# Patient Record
Sex: Female | Born: 2000 | Hispanic: Yes | Marital: Single | Smoking: Never smoker
Health system: Southern US, Community
[De-identification: ages and names within clinical notes are randomized; demographics above are authoritative.]

## PROBLEM LIST (undated history)

## (undated) DIAGNOSIS — R569 Unspecified convulsions: Secondary | ICD-10-CM

## (undated) DIAGNOSIS — G40909 Epilepsy, unspecified, not intractable, without status epilepticus: Secondary | ICD-10-CM

## (undated) HISTORY — PX: NO PAST SURGERIES: SHX2092

## (undated) HISTORY — DX: Epilepsy, unspecified, not intractable, without status epilepticus: G40.909

---
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
## 2022-02-12 NOTE — Progress Notes (Incomplete)
? ?  Patient: Mary Giles MRN: [id number] ?Sex: female DOB: 2001-04-14 ? ?Provider: Lorenz Coaster, MD ?Location of Care: [facility] ? ?Note type: Routine follow-up ? ?History of Present Illness: ? ?Mary Giles is a 21 y.o. female with history of generalized epilepsy who I am seeing for routine follow-up. Patient was last seen on 08/13/21 where I continued Briviact and refilled Valtoco.  Since the last appointment, there are no relevant visits noted in the patients chart.  ? ?Patient presents today with ***.    ? ? ?Screenings: ?PHQSADS ? ?Patient History:  ? ?Diagnostics:  ? ? ?Past Medical History ?Past Medical History:  ?Diagnosis Date  ? Seizures (HCC) 04/09/2021  ? ? ?Surgical History ?Past Surgical History:  ?Procedure Laterality Date  ? NO PAST SURGERIES    ? ? ?Family History ?family history includes Seizures in her father. ? ? ?Social History ?Social History  ? ?Social History Narrative  ? Lives at home with mother, father (step-father), 4 siblings (1 sister, 2 brothers). Pets in home include 2 dogs, 1 rabbit, 1 chinchilla, 1 sugar glider.  ?   ? 11th grade at [employer]  ? ? ?Allergies ?Allergies  ?Allergen Reactions  ? Keppra [Levetiracetam] Other (See Comments)  ?  Caused VERY, VERY NEGATIVE thoughts  ? Coconut Oil Hives  ? ? ?Medications ?Current Outpatient Medications on File Prior to Visit  ?Medication Sig Dispense Refill  ? brivaracetam (BRIVIACT) 100 MG TABS tablet Take 1 tablet (100 mg total) by mouth 2 (two) times daily. 60 tablet 6  ? clonazePAM (KLONOPIN) 0.5 MG disintegrating tablet Take 1 tablet (0.5 mg total) by mouth 2 (two) times daily as needed for seizure (for seizure clusters (>5 events in 1 hour)). (Patient not taking: Reported on 08/13/2021) 5 tablet 0  ? diazePAM, 15 MG Dose, (VALTOCO 15 MG DOSE) 2 x 7.5 MG/0.1ML LQPK Place 15 mg into the nose as needed (for seizure lasting longer than 5 minutes). 2 each 2  ? ibuprofen (ADVIL) 400 MG tablet Take 1 tablet  (400 mg total) by mouth every 6 (six) hours as needed. (Patient not taking: Reported on 08/13/2021) 30 tablet 0  ? loratadine (CLARITIN) 5 MG chewable tablet Chew 5 mg by mouth daily as needed for allergies. (Patient not taking: Reported on 08/13/2021)    ? ?No current facility-administered medications on file prior to visit.  ? ?The medication list was reviewed and reconciled. All changes or newly prescribed medications were explained.  A complete medication list was provided to the patient/caregiver. ? ?Physical Exam ?There were no vitals taken for this visit. ?No weight on file for this encounter.  ?No results found. ? ?*** ? ? ?Diagnosis:No diagnosis found.  ? ?Assessment and Plan ?Mary Giles is a 21 y.o. female with history of generalized epilepsy who I am seeing in follow-up.  ? ?I spent *** minutes on day of service on this patient including review of chart, discussion with patient and family, discussion of screening results, coordination with other providers and management of orders and paperwork.    ? ?No follow-ups on file. ? ?I, Ellie Canty, scribed for and in the presence of Lorenz Coaster, MD at today's visit on 02/16/2022.  ? ?Lorenz Coaster MD MPH ?Neurology and Neurodevelopment ?[facility] ? ?[address], [place], [place] [address] ?Phone: [phone] ?Fax: [phone]  ?

## 2022-02-16 ENCOUNTER — Ambulatory Visit (INDEPENDENT_AMBULATORY_CARE_PROVIDER_SITE_OTHER): Payer: Medicaid Other | Admitting: Pediatrics

## 2022-02-26 ENCOUNTER — Other Ambulatory Visit (INDEPENDENT_AMBULATORY_CARE_PROVIDER_SITE_OTHER): Payer: Self-pay | Admitting: Pediatrics

## 2022-02-26 DIAGNOSIS — G40309 Generalized idiopathic epilepsy and epileptic syndromes, not intractable, without status epilepticus: Secondary | ICD-10-CM

## 2022-02-28 IMAGING — CR DG SHOULDER 2+V*R*
2 series · 2 of 2 positions shown · non-contrast
Comparison: None.

CLINICAL DATA: Fall, right upper arm pain.

EXAM:
RIGHT SHOULDER - 2+ VIEW

[w shoulder external right]
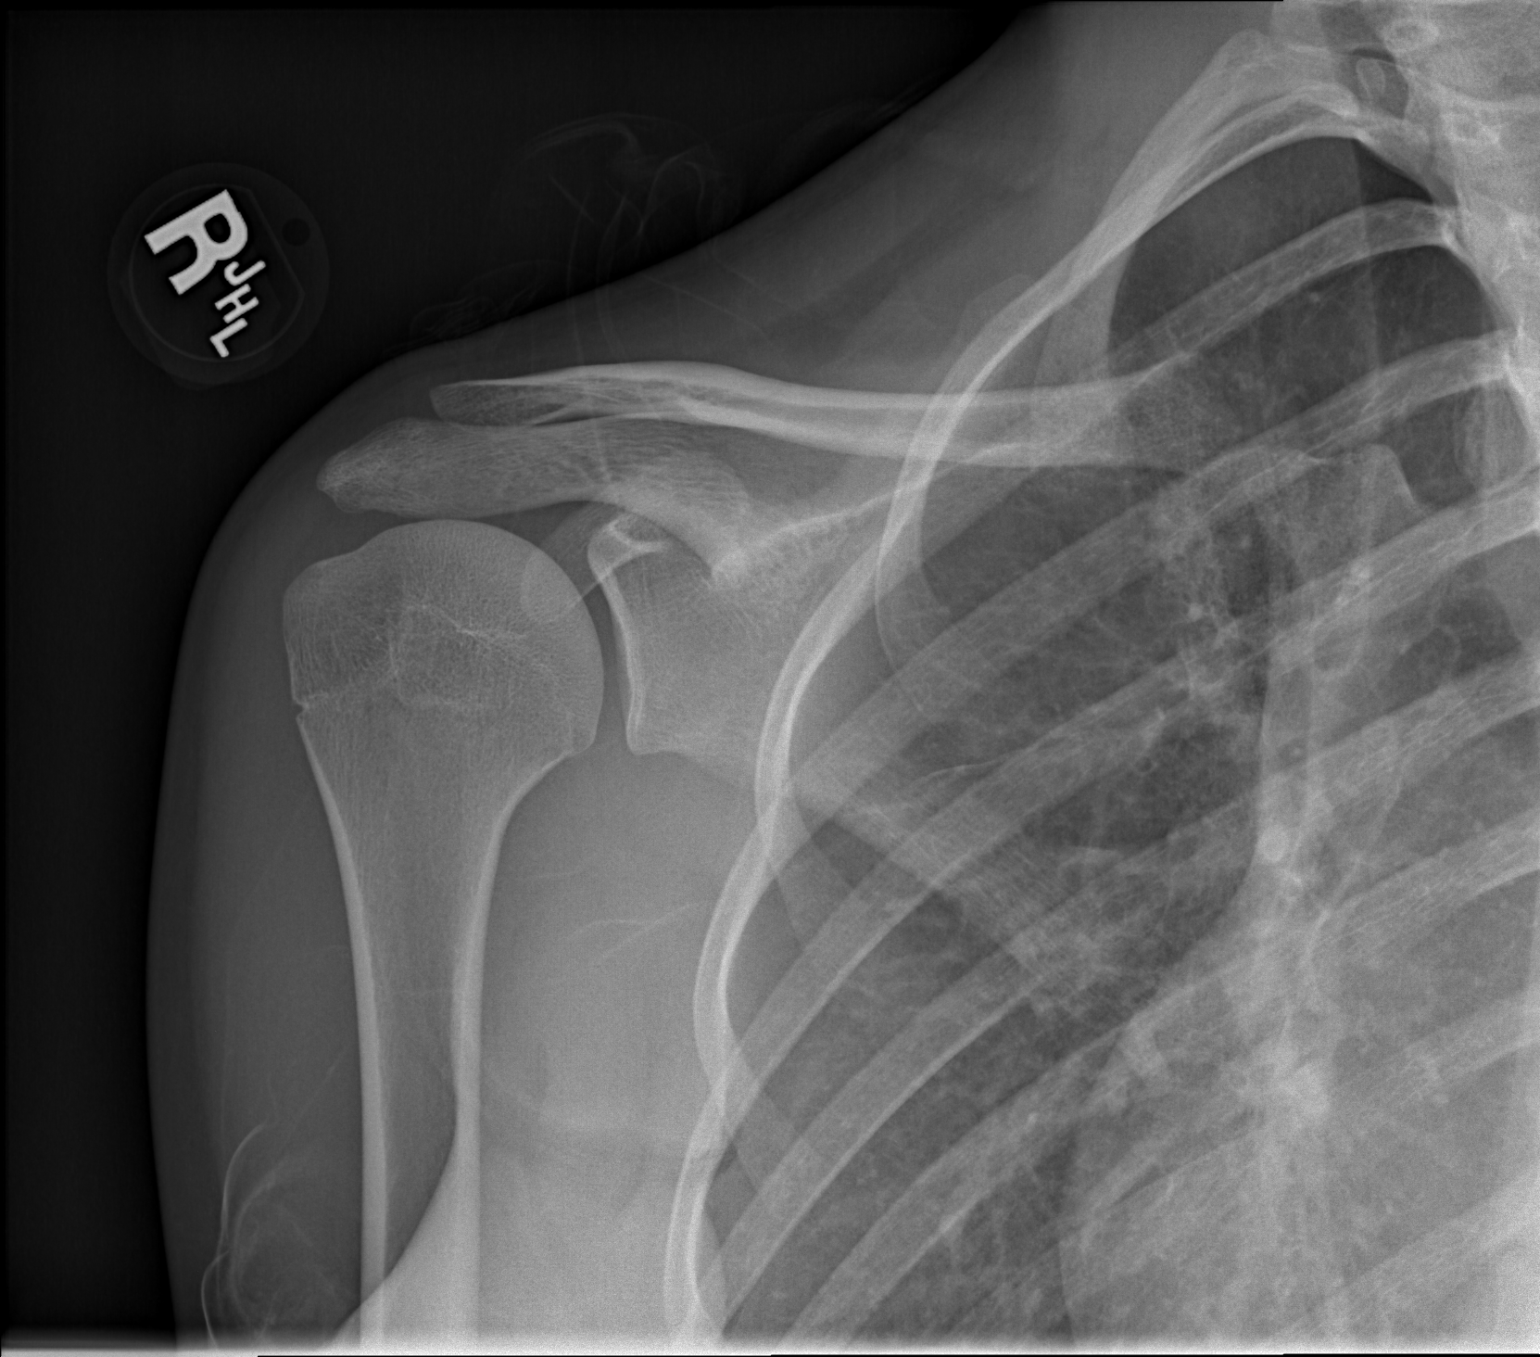

[w shoulder y-view right]
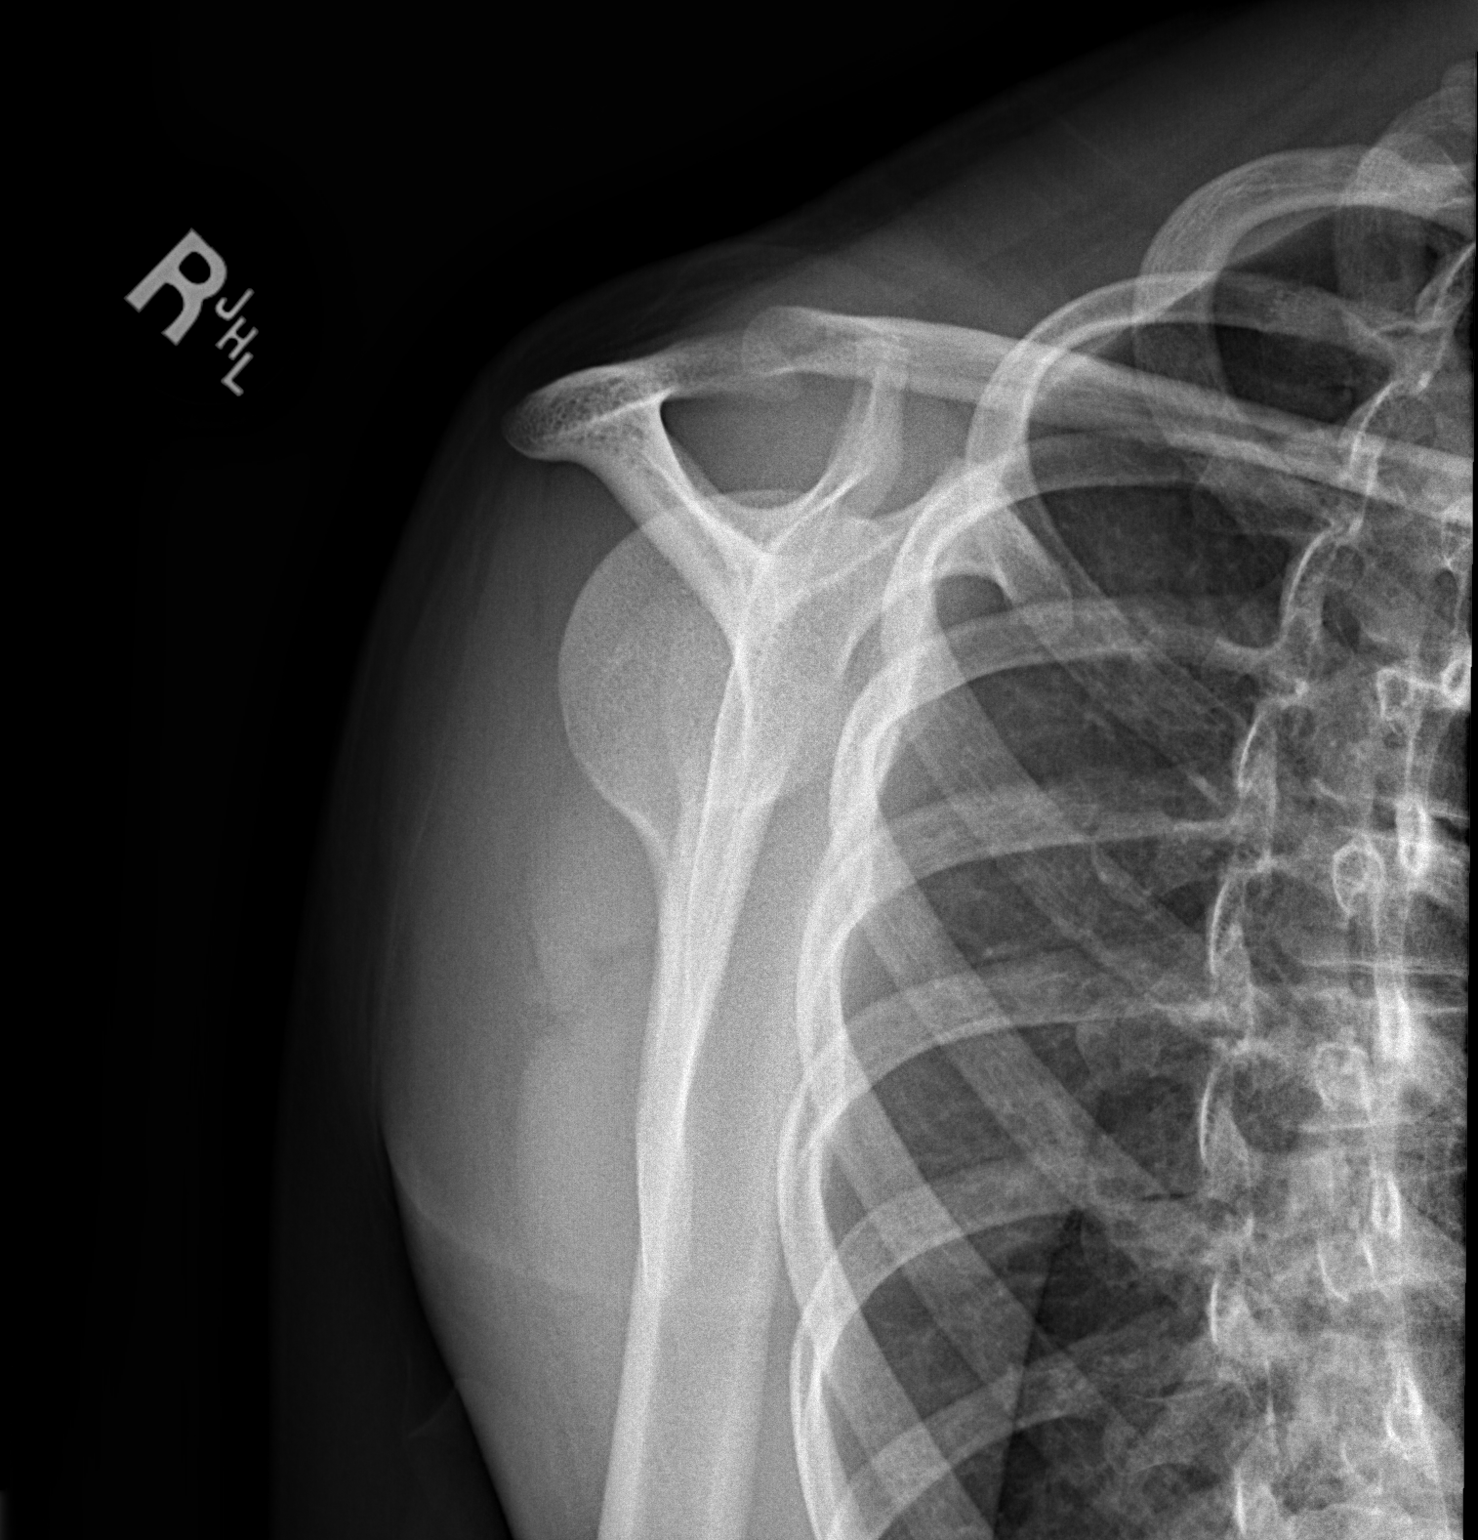

[2 of 2 positions shown; findings below may reference images not displayed]

FINDINGS: There is no evidence of fracture or dislocation. There is no
evidence of arthropathy or other focal bone abnormality. Soft
tissues are unremarkable.
IMPRESSION: Negative.

## 2022-03-04 ENCOUNTER — Emergency Department (HOSPITAL_COMMUNITY): Payer: Medicaid Other

## 2022-03-04 ENCOUNTER — Emergency Department (HOSPITAL_COMMUNITY)
Admission: EM | Admit: 2022-03-04 | Discharge: 2022-03-04 | Disposition: A | Discharge status: Home / Self Care | Payer: Medicaid Other | Attending: Emergency Medicine | Admitting: Emergency Medicine

## 2022-03-04 ENCOUNTER — Other Ambulatory Visit: Payer: Self-pay

## 2022-03-04 ENCOUNTER — Encounter (HOSPITAL_COMMUNITY): Payer: Self-pay | Admitting: Emergency Medicine

## 2022-03-04 DIAGNOSIS — R569 Unspecified convulsions: Secondary | ICD-10-CM | POA: Diagnosis not present

## 2022-03-04 DIAGNOSIS — R519 Headache, unspecified: Secondary | ICD-10-CM | POA: Diagnosis not present

## 2022-03-04 LAB — BASIC METABOLIC PANEL
Anion gap: 6 (ref 5–15)
BUN: 14 mg/dL (ref 6–20)
CO2: 23 mmol/L (ref 22–32)
Calcium: 8.8 mg/dL — ABNORMAL LOW (ref 8.9–10.3)
Chloride: 110 mmol/L (ref 98–111)
Creatinine, Ser: 0.58 mg/dL (ref 0.44–1.00)
GFR, Estimated: >60 mL/min (ref >60)
Glucose, Bld: 75 mg/dL (ref 70–99)
Potassium: 3.7 mmol/L (ref 3.5–5.1)
Sodium: 139 mmol/L (ref 135–145)

## 2022-03-04 LAB — URINALYSIS, ROUTINE W REFLEX MICROSCOPIC
Bilirubin Urine: NEGATIVE
Glucose, UA: NEGATIVE mg/dL
Hgb urine dipstick: NEGATIVE
Ketones, ur: NEGATIVE mg/dL
Leukocytes,Ua: NEGATIVE
Nitrite: NEGATIVE
Protein, ur: NEGATIVE mg/dL
Specific Gravity, Urine: 1.01 (ref 1.005–1.030)
pH: 7 (ref 5.0–8.0)

## 2022-03-04 LAB — CBC WITH DIFFERENTIAL/PLATELET
Abs Immature Granulocytes: 0.01 10*3/uL (ref 0.00–0.07)
Basophils Absolute: 0 10*3/uL (ref 0.0–0.1)
Basophils Relative: 0 %
Eosinophils Absolute: 0.1 10*3/uL (ref 0.0–0.5)
Eosinophils Relative: 1 %
HCT: 38 % (ref 36.0–46.0)
Hemoglobin: 12.3 g/dL (ref 12.0–15.0)
Immature Granulocytes: 0 %
Lymphocytes Relative: 23 %
Lymphs Abs: 1.3 10*3/uL (ref 0.7–4.0)
MCH: 30 pg (ref 26.0–34.0)
MCHC: 32.4 g/dL (ref 30.0–36.0)
MCV: 92.7 fL (ref 80.0–100.0)
Monocytes Absolute: 0.4 10*3/uL (ref 0.1–1.0)
Monocytes Relative: 7 %
Neutro Abs: 3.9 10*3/uL (ref 1.7–7.7)
Neutrophils Relative %: 69 %
Platelets: 197 10*3/uL (ref 150–400)
RBC: 4.1 MIL/uL (ref 3.87–5.11)
RDW: 12.2 % (ref 11.5–15.5)
WBC: 5.6 10*3/uL (ref 4.0–10.5)
nRBC: 0 % (ref 0.0–0.2)

## 2022-03-04 LAB — HCG, QUANTITATIVE, PREGNANCY: hCG, Beta Chain, Quant, S: <1 m[IU]/mL (ref <5)

## 2022-03-04 LAB — PREGNANCY, URINE: Preg Test, Ur: NEGATIVE

## 2022-03-04 LAB — CBG MONITORING, ED: Glucose-Capillary: 72 mg/dL (ref 70–99)

## 2022-03-04 NOTE — ED Notes (Signed)
Pt verbalized understanding of d/c instructions, meds, and followup care. Denies questions. VSS, no distress noted. Steady gait to exit with all belongings.  ?

## 2022-03-04 NOTE — ED Provider Notes (Signed)
?[facility] EMERGENCY DEPARTMENT ?Provider Note ? ? ?CSN: [id number] ?Arrival date & time: 03/04/22  1547 ? ?  ? ?History ? ?Chief Complaint  ?Patient presents with  ? Seizures  ? ? ?Mary Giles is a 21 y.o. female. ? ?Patient presents chief complaint of seizure.  She has known history of seizure disorder and has been taking seizure medications most of her life.  Last seizure that she is has almost a year ago per family.  She states that she was in the shower in the last thing she remembers she was waking up from the ground.  Mother is unsure how long she had a seizure.  Currently complaining of some neck pain described as mild.  No fever no cough no vomiting or diarrhea.  No history of alcohol use. ? ? ?  ? ?Home Medications ?Prior to Admission medications   ?Medication Sig Start Date End Date Taking? Authorizing Provider  ?BRIVIACT 100 MG TABS tablet TAKE 1 TABLET BY MOUTH TWICE A DAY 02/27/22   Keturah Shavers, MD  ?clonazePAM (KLONOPIN) 0.5 MG disintegrating tablet Take 1 tablet (0.5 mg total) by mouth 2 (two) times daily as needed for seizure (for seizure clusters (>5 events in 1 hour)). ?Patient not taking: Reported on 08/13/2021 03/16/21   Charlett Nose, MD  ?diazePAM, 15 MG Dose, (VALTOCO 15 MG DOSE) 2 x 7.5 MG/0.1ML LQPK Place 15 mg into the nose as needed (for seizure lasting longer than 5 minutes). 08/13/21   Margurite Auerbach, MD  ?ibuprofen (ADVIL) 400 MG tablet Take 1 tablet (400 mg total) by mouth every 6 (six) hours as needed. ?Patient not taking: Reported on 08/13/2021 05/08/21   Fayrene Helper, PA-C  ?loratadine (CLARITIN) 5 MG chewable tablet Chew 5 mg by mouth daily as needed for allergies. ?Patient not taking: Reported on 08/13/2021    [provider]  ?   ? ?Allergies    ?Keppra [levetiracetam] and Coconut oil   ? ?Review of Systems   ?Review of Systems  ?Constitutional:  Negative for fever.  ?HENT:  Negative for ear pain.   ?Eyes:  Negative for pain.  ?Respiratory:  Negative  for cough.   ?Cardiovascular:  Negative for chest pain.  ?Gastrointestinal:  Negative for abdominal pain.  ?Genitourinary:  Negative for flank pain.  ?Musculoskeletal:  Negative for back pain.  ?Skin:  Negative for rash.  ?Neurological:  Positive for headaches.  ? ?Physical Exam ?Updated Vital Signs ?There were no vitals taken for this visit. ?Physical Exam ?Constitutional:   ?   General: She is not in acute distress. ?   Appearance: Normal appearance.  ?HENT:  ?   Head: Normocephalic.  ?   Nose: Nose normal.  ?Eyes:  ?   Extraocular Movements: Extraocular movements intact.  ?Cardiovascular:  ?   Rate and Rhythm: Normal rate.  ?Pulmonary:  ?   Effort: Pulmonary effort is normal.  ?Musculoskeletal:     ?   General: Normal range of motion.  ?   Cervical back: Normal range of motion.  ?   Comments: Mild C3-4 midline tenderness.  No T or L-spine tenderness noted.  Moving all extremities without pain or deformity.  ?Neurological:  ?   General: No focal deficit present.  ?   Mental Status: She is alert. Mental status is at baseline.  ? ? ?ED Results / Procedures / Treatments   ?Labs ?(all labs ordered are listed, but only abnormal results are displayed) ?Labs Reviewed  ?BASIC METABOLIC  PANEL - Abnormal; Notable for the following components:  ?    Result Value  ? Calcium 8.8 (*)   ? All other components within normal limits  ?CBC WITH DIFFERENTIAL/PLATELET  ?HCG, QUANTITATIVE, PREGNANCY  ?PREGNANCY, URINE  ?URINALYSIS, ROUTINE W REFLEX MICROSCOPIC  ?CBG MONITORING, ED  ? ? ?EKG ?EKG Interpretation ? ?Date/Time:  Wednesday March 04 2022 15:54:02 EDT ?Ventricular Rate:  74 ?PR Interval:  118 ?QRS Duration: 84 ?QT Interval:  373 ?QTC Calculation: 414 ?R Axis:   69 ?Text Interpretation: Sinus rhythm Borderline short PR interval Confirmed by Norman Clay ([id number]) on 03/04/2022 4:58:03 PM ? ?Radiology ?CT Head Wo Contrast ? ?Result Date: 03/04/2022 ?CLINICAL DATA:  Trauma EXAM: CT HEAD WITHOUT CONTRAST TECHNIQUE: Contiguous axial  images were obtained from the base of the skull through the vertex without intravenous contrast. RADIATION DOSE REDUCTION: This exam was performed according to the departmental dose-optimization program which includes automated exposure control, adjustment of the mA and/or kV according to patient size and/or use of iterative reconstruction technique. COMPARISON:  01/01/2007 FINDINGS: Brain: No acute intracranial findings are seen. There are no epidural or subdural fluid collections. Ventricles are not dilated. There is no focal edema or mass effect. Vascular: Unremarkable. Skull: Unremarkable. Sinuses/Orbits: There is mild mucosal thickening in the ethmoid sinus. Other: None IMPRESSION: No acute intracranial findings are seen in noncontrast CT brain. Mild chronic ethmoid sinusitis. Electronically Signed   By: Ernie Avena M.D.   On: 03/04/2022 18:00  ? ?CT Cervical Spine Wo Contrast ? ?Result Date: 03/04/2022 ?CLINICAL DATA:  Trauma EXAM: CT CERVICAL SPINE WITHOUT CONTRAST TECHNIQUE: Multidetector CT imaging of the cervical spine was performed without intravenous contrast. Multiplanar CT image reconstructions were also generated. RADIATION DOSE REDUCTION: This exam was performed according to the departmental dose-optimization program which includes automated exposure control, adjustment of the mA and/or kV according to patient size and/or use of iterative reconstruction technique. COMPARISON:  None. FINDINGS: Alignment: Alignment of posterior margins of vertebral bodies is unremarkable. Skull base and vertebrae: No recent fracture is seen. Soft tissues and spinal canal: There is no central spinal stenosis. Disc levels: There is no significant disc space narrowing. There is no significant encroachment of neural foramina. Upper chest: Unremarkable. Other: There is inhomogeneous attenuation in the thyroid with possible nodules. IMPRESSION: No recent fracture is seen in the cervical spine. There is inhomogeneous  attenuation in the thyroid with possible nodules. Follow-up thyroid sonogram in outpatient setting should be considered. Electronically Signed   By: Ernie Avena M.D.   On: 03/04/2022 18:02   ? ?Procedures ?Procedures  ? ? ?Medications Ordered in ED ?Medications - No data to display ? ?ED Course/ Medical Decision Making/ A&P ?  ?                        ?Medical Decision Making ?Amount and/or Complexity of Data Reviewed ?Labs: ordered. ?Radiology: ordered. ? ? ?Chart review shows office visit with neurology last year. ? ?History obtained from father at bedside as well. ? ?Cardiac monitoring shows sinus rhythm. ? ?Work-up includes labs.  CBC chemistry these are unremarkable.  Urine pregnancy test is negative. ? ?Imaging pursued of the head and C-spine which were unremarkable per radiology. ? ?Patient advised close outpatient follow-up with her neurologist this week.  Advise no driving drinking or operation of heavy machinery or swimming. ? ?Advising immediate return for recurrent seizures or any additional concerns. ? ? ? ? ? ? ? ?Final Clinical Impression(s) /  ED Diagnoses ?Final diagnoses:  ?Seizure (HCC)  ? ? ?Rx / DC Orders ?ED Discharge Orders   ? ? None  ? ?  ? ? ?  ?Cheryll CockayneHong, Macoy Rodwell S, MD ?03/04/22 1903 ? ?

## 2022-03-04 NOTE — ED Triage Notes (Addendum)
BIB [facility]. Extensive seizure hx. Seizure today @ 1510- given IN diazepam- continued to seize -given 2.5 midazolam by [facility] @ 1525- seizure stopped. Upon arrival to ED remains confused/somnolent/headache. Father at bedside. Pt reports she is ~[redacted] weeks pregnant (family does not know). Pt reports she did fall in bathroom- some pain to head upon palpation. ? ?EMS vitals ?100/70 ?70HR ?CBG 99 ?

## 2022-03-04 NOTE — ED Notes (Signed)
Patient transported to CT 

## 2022-03-04 NOTE — Discharge Instructions (Addendum)
SEIZURE PRECAUTIONS °Per [place] DMV statutes, patients with seizures are not allowed to drive until they have been seizure-free for six months. °  °Use caution when using heavy equipment or power tools. Avoid working on ladders or at heights. Take showers instead of baths. Ensure the water temperature is not too high on the home water heater. Do not go swimming alone. Do not lock yourself in a room alone (i.e. bathroom). When caring for infants or small children, sit down when holding, feeding, or changing them to minimize risk of injury to the child in the event you have a seizure. Maintain good sleep hygiene. Avoid alcohol. °  °If patient has another seizure, call 911 and bring them back to the ED if: °A.  The seizure lasts longer than 5 minutes.      °B.  The patient doesn't wake shortly after the seizure or has new problems such as difficulty seeing, speaking or moving following the seizure °C.  The patient was injured during the seizure °D.  The patient has a temperature over 102 F (39C) °E.  The patient vomited during the seizure and now is having trouble breathing ° ° °

## 2022-03-04 NOTE — ED Notes (Signed)
Patient more alert. Sits up in bed. Given small sip juice. Tolerates well, airway not compromised. Given 8oz apple juice. ?

## 2022-03-10 ENCOUNTER — Encounter (HOSPITAL_COMMUNITY): Payer: Self-pay

## 2022-03-10 ENCOUNTER — Emergency Department (HOSPITAL_COMMUNITY)
Admission: EM | Admit: 2022-03-10 | Discharge: 2022-03-10 | Disposition: A | Discharge status: Home / Self Care | Payer: Medicaid Other | Attending: Emergency Medicine | Admitting: Emergency Medicine

## 2022-03-10 DIAGNOSIS — F419 Anxiety disorder, unspecified: Secondary | ICD-10-CM | POA: Diagnosis not present

## 2022-03-10 DIAGNOSIS — R300 Dysuria: Secondary | ICD-10-CM | POA: Insufficient documentation

## 2022-03-10 DIAGNOSIS — R82998 Other abnormal findings in urine: Secondary | ICD-10-CM | POA: Diagnosis not present

## 2022-03-10 DIAGNOSIS — R350 Frequency of micturition: Secondary | ICD-10-CM | POA: Diagnosis not present

## 2022-03-10 LAB — URINALYSIS, ROUTINE W REFLEX MICROSCOPIC
Bacteria, UA: NONE SEEN
Bilirubin Urine: NEGATIVE
Glucose, UA: NEGATIVE mg/dL
Hgb urine dipstick: NEGATIVE
Ketones, ur: NEGATIVE mg/dL
Nitrite: NEGATIVE
Protein, ur: NEGATIVE mg/dL
Specific Gravity, Urine: 1.027 (ref 1.005–1.030)
pH: 8 (ref 5.0–8.0)

## 2022-03-10 LAB — PREGNANCY, URINE: Preg Test, Ur: NEGATIVE

## 2022-03-10 MED ORDER — PHENAZOPYRIDINE HCL 200 MG PO TABS: 200.0000 mg | ORAL_TABLET | Freq: Three times a day (TID) | ORAL | 0 refills | Status: DC

## 2022-03-10 NOTE — ED Provider Notes (Signed)
?[facility]-EMERGENCY DEPT ?Provider Note ? ? ?CSN: [id number] ?Arrival date & time: 03/10/22  1925 ? ?  ? ?History ? ?Chief Complaint  ?Patient presents with  ? Urinary Frequency  ? ? ?Mary Giles is a 21 y.o. female. ? ?21 yo female presents with complaint of dysuria and frequency onset today. Denies changes in bowel habits, abnormal vaginal discharge, nausea, vomiting, fevers, chills, abdominal or back pain.  ?Past medical history of seizures.  ?Seen in the ER 03/04/22 for seizure, hcg negative with normal UA at that time. ? ? ?  ? ?Home Medications ?Prior to Admission medications   ?Medication Sig Start Date End Date Taking? Authorizing Provider  ?BRIVIACT 100 MG TABS tablet TAKE 1 TABLET BY MOUTH TWICE A DAY 02/27/22   Keturah Shavers, MD  ?phenazopyridine (PYRIDIUM) 200 MG tablet Take 1 tablet (200 mg total) by mouth 3 (three) times daily. 03/10/22  Yes Jeannie Fend, PA-C  ?clonazePAM (KLONOPIN) 0.5 MG disintegrating tablet Take 1 tablet (0.5 mg total) by mouth 2 (two) times daily as needed for seizure (for seizure clusters (>5 events in 1 hour)). ?Patient not taking: Reported on 08/13/2021 03/16/21   Charlett Nose, MD  ?diazePAM, 15 MG Dose, (VALTOCO 15 MG DOSE) 2 x 7.5 MG/0.1ML LQPK Place 15 mg into the nose as needed (for seizure lasting longer than 5 minutes). 08/13/21   Margurite Auerbach, MD  ?ibuprofen (ADVIL) 400 MG tablet Take 1 tablet (400 mg total) by mouth every 6 (six) hours as needed. ?Patient not taking: Reported on 08/13/2021 05/08/21   Fayrene Helper, PA-C  ?loratadine (CLARITIN) 5 MG chewable tablet Chew 5 mg by mouth daily as needed for allergies. ?Patient not taking: Reported on 08/13/2021    [provider]  ?   ? ?Allergies    ?Keppra [levetiracetam] and Coconut oil   ? ?Review of Systems   ?Review of Systems ?Negative except as per HPI ?Physical Exam ?Updated Vital Signs ?BP 111/76 (BP Location: Left Arm)   Pulse 98   Temp 98.4 ?F (36.9 ?C) (Oral)   Resp 16   Ht 4'  11" (1.499 m)   Wt 40.4 kg   LMP 02/24/2022   SpO2 98%   BMI 17.98 kg/m?  ?Physical Exam ?Vitals and nursing note reviewed.  ?Constitutional:   ?   General: She is not in acute distress. ?   Appearance: She is well-developed. She is not diaphoretic.  ?HENT:  ?   Head: Normocephalic and atraumatic.  ?Cardiovascular:  ?   Rate and Rhythm: Normal rate and regular rhythm.  ?   Heart sounds: Normal heart sounds.  ?Pulmonary:  ?   Effort: Pulmonary effort is normal.  ?   Breath sounds: Normal breath sounds.  ?Abdominal:  ?   Palpations: Abdomen is soft.  ?   Tenderness: There is no abdominal tenderness. There is no right CVA tenderness or left CVA tenderness.  ?Skin: ?   General: Skin is warm and dry.  ?   Findings: No erythema or rash.  ?Neurological:  ?   Mental Status: She is alert and oriented to person, place, and time.  ?Psychiatric:     ?   Mood and Affect: Mood is anxious.     ?   Behavior: Behavior normal.  ? ? ?ED Results / Procedures / Treatments   ?Labs ?(all labs ordered are listed, but only abnormal results are displayed) ?Labs Reviewed  ?URINALYSIS, ROUTINE W REFLEX MICROSCOPIC - Abnormal; Notable for  the following components:  ?    Result Value  ? Leukocytes,Ua TRACE (*)   ? All other components within normal limits  ?URINE CULTURE  ?PREGNANCY, URINE  ? ? ?EKG ?None ? ?Radiology ?No results found. ? ?Procedures ?Procedures  ? ? ?Medications Ordered in ED ?Medications - No data to display ? ?ED Course/ Medical Decision Making/ A&P ?  ?                        ?Medical Decision Making ?Amount and/or Complexity of Data Reviewed ?Labs: ordered. ? ?Risk ?Prescription drug management. ? ? ?21 yo female with complaint of dysuria and frequency onset today. Exam unremarkable, no CVA tenderness, no abdominal pain. No GYN symptoms, denies fever/nausea/vomiting, doubt pyelonephritis. ?UA with trace leukocytes, negative for nitrites, bacteria. Will send for culture, given pyridium for discomfort pending culture  report.  ?HCG negative.  ? ? ? ? ? ? ? ?Final Clinical Impression(s) / ED Diagnoses ?Final diagnoses:  ?Dysuria  ? ? ?Rx / DC Orders ?ED Discharge Orders   ? ?      Ordered  ?  phenazopyridine (PYRIDIUM) 200 MG tablet  3 times daily       ? 03/10/22 2006  ? ?  ?  ? ?  ? ? ?  ?Jeannie Fend, PA-C ?03/10/22 2122 ? ?  ?Terrilee Files, MD ?03/11/22 [phone] ? ?

## 2022-03-10 NOTE — ED Triage Notes (Signed)
Patient arrived with complaints of urinary frequency and pain.  ?

## 2022-03-10 NOTE — Discharge Instructions (Addendum)
Take Pyridium as needed as prescribed for painful urination. ?A culture has been sent out. If there is infection in your urine, an antibiotic will be sent to your pharmacy. ?Stay hydrated, drinking cranberry juice can prevent bacteria from sticking to the lining of your bladder. ?

## 2022-03-12 LAB — URINE CULTURE: Culture: NO GROWTH

## 2022-03-17 NOTE — Progress Notes (Signed)
? ?Patient: Mary Giles MRN: [id number] ?Sex: female DOB: 01/27/2001 ? ?Provider: Carylon Perches, MD ?Location of Care: [facility] ? ?Note type: Routine follow-up ? ?This is a Pediatric Specialist E-Visit follow up consult provided via [place] ?Mary Giles consented to an E-Visit consult today.  ?Location of patient: Mary Giles is at her home in [place], [place] ?Location of provider: Carylon Perches, MD is at [facility] ? ?The following participants were involved in this E-Visit:  Carylon Perches, MD, Freddrick March, RMA, Scharlene Gloss, Scribe, [place], patient. ? ?This visit was done via VIDEO   ? ?History of Present Illness: ? ?Mary Giles is a 21 y.o. female with history of generalized epilepsy who I am seeing for routine follow-up. Patient was last seen on 08/13/21 where I continued Briviact 150mg  BID and refilled Valtoco.  Since the last appointment, she was seen in the ED twice for seizure (03/04/22) and dysuria (03/10/22). ? ?Patient presents today who reports that she had a seizure a few days ago. During this event Dad administered Valtoco.  She notes that she feels this may be due to her mood, she recalls feeling upset before this event.    ? ?She reports she has had worsening anxiety, with panic attacks in which her heart racing and crying. She explains that she feels that these are similar to her seizures, although she associates the ones where she can't breathe with seizure. Anxiety can be brought on at work and with family stress. She would be interested in counseling to address this anxiety. She notes that she feels that if she does not see counseling everything will be "too much." Denies thoughts of [place] or harm to others.  ? ?No longer using Klonopin. She continues to take her briviact regularly. She also is interested in birth control options.  ? ?No trouble with sleep. She has graduated from [employer] and is working full time.  ? ?Screenings: ? ?   03/23/2022  ?  9:00 AM 08/13/2021  ?  4:00 PM 04/21/2021  ?  3:00 PM 02/05/2021  ?  4:00 PM  ?PHQ-SADS Score Only  ?PHQ-15 3 0  12  ?GAD-7 8 1  16   ?Anxiety attacks Yes No  No  ?PHQ-9 2 0 1 12  ?Suicidal Ideation No No  Yes  ?Any difficulty to complete tasks? Not difficult at all Not difficult at all  Not difficult at all  ? ?Past Medical History ?Past Medical History:  ?Diagnosis Date  ? Seizures ([place]) 04/09/2021  ? ? ?Surgical History ?Past Surgical History:  ?Procedure Laterality Date  ? NO PAST SURGERIES    ? ? ?Family History ?family history includes Seizures in her father. ? ? ?Social History ?Social History  ? ?Social History Narrative  ? Lives at home with mother, father (step-father), 4 siblings (1 sister, 2 brothers). Pets in home include 2 dogs, 1 rabbit, 1 chinchilla, 1 sugar glider.  ?  Graduated in the fall of 22  ? She is working at [employer] as a Scientist, water quality.   ? ? ?Allergies ?Allergies  ?Allergen Reactions  ? Keppra [Levetiracetam] Other (See Comments)  ?  Caused VERY, VERY NEGATIVE thoughts  ? Coconut Oil Hives  ? ? ?Medications ?Current Outpatient Medications on File Prior to Visit  ?Medication Sig Dispense Refill  ? diazePAM, 15 MG Dose, (VALTOCO 15 MG DOSE) 2 x 7.5 MG/0.1ML LQPK Place 15 mg into the nose as needed (for seizure lasting longer than 5 minutes). 2 each 2  ?  loratadine (CLARITIN) 5 MG chewable tablet Chew 5 mg by mouth daily as needed for allergies.    ? phenazopyridine (PYRIDIUM) 200 MG tablet Take 1 tablet (200 mg total) by mouth 3 (three) times daily. 6 tablet 0  ? ibuprofen (ADVIL) 400 MG tablet Take 1 tablet (400 mg total) by mouth every 6 (six) hours as needed. (Patient not taking: Reported on 08/13/2021) 30 tablet 0  ? ?No current facility-administered medications on file prior to visit.  ? ?The medication list was reviewed and reconciled. All changes or newly prescribed medications were explained.  A complete medication list was provided to the patient/caregiver. ? ?Physical Exam ?LMP  02/24/2022  ?No weight on file for this encounter.  ?No results found. ?Exam limited due to virtual visit ?General: NAD, well nourished  ?HEENT: normocephalic, no eye or nose discharge.  MMM  ?Cardiovascular: warm and well perfused ?Lungs: Normal work of breathing, no rhonchi or stridor ?Neuro: EOM intact, face symmetric. No abnormal movements.  ? ? ? ?Diagnosis: ?1. Epilepsy, generalized, convulsive ([place])   ?2. Psychogenic nonepileptic seizure   ?3. Anxiety state   ?4. Positive depression screening   ?  ? ?Assessment and Plan ?Mary Giles is a 21 y.o. female with history of generalized epilepsy who I am seeing in follow-up. Epilepsy seems well controled on Briviact, her breakthrough event is likely psychogenic given her upset emotional state. Patient is requesting counseling and we provided information on this today as well as placed a referral. She also expressed interest in birth control options for which I recommended she follow up with her PCP, information provided today.  ? ?- Continued Briviact  ?- [facility]  ?- Provided information on PCP for patient to call and schedule.  ? ?I spent 23 minutes on day of service on this patient including review of chart, discussion with patient and family, discussion of screening results, coordination with other providers and management of orders and paperwork.    ? ?Return in about 3 months (around 06/22/2022). ? ?I, Ellie Canty, scribed for and in the presence of Carylon Perches, MD at today's visit on 03/23/2022.  ? ?I, Carylon Perches MD MPH, personally performed the services described in this documentation, as scribed by Scharlene Gloss in my presence on 03/23/2022 and it is accurate, complete, and reviewed by me.  ? ? ?Carylon Perches MD MPH ?Neurology and Neurodevelopment ?[facility] ? ?[address] [place], [place] [address] ?Phone: [phone] ?Fax: [phone]  ?

## 2022-03-23 ENCOUNTER — Encounter (INDEPENDENT_AMBULATORY_CARE_PROVIDER_SITE_OTHER): Payer: Self-pay | Admitting: Pediatrics

## 2022-03-23 ENCOUNTER — Telehealth (INDEPENDENT_AMBULATORY_CARE_PROVIDER_SITE_OTHER): Payer: Medicaid Other | Admitting: Pediatrics

## 2022-03-23 DIAGNOSIS — F445 Conversion disorder with seizures or convulsions: Secondary | ICD-10-CM

## 2022-03-23 DIAGNOSIS — G40309 Generalized idiopathic epilepsy and epileptic syndromes, not intractable, without status epilepticus: Secondary | ICD-10-CM | POA: Diagnosis not present

## 2022-03-23 DIAGNOSIS — Z1331 Encounter for screening for depression: Secondary | ICD-10-CM

## 2022-03-23 DIAGNOSIS — F411 Generalized anxiety disorder: Secondary | ICD-10-CM | POA: Diagnosis not present

## 2022-03-23 MED ORDER — BRIVARACETAM 100 MG PO TABS: 100.0000 mg | ORAL_TABLET | Freq: Two times a day (BID) | ORAL | 5 refills | Status: DC

## 2022-03-23 MED ORDER — BRIVARACETAM 100 MG PO TABS: 100.0000 mg | ORAL_TABLET | Freq: Two times a day (BID) | ORAL | 0 refills | Status: DC

## 2022-03-23 NOTE — Patient Instructions (Addendum)
I think counseling is a great idea, there are walk-in counseling clinics at the [facility]  ? ?[facility] ?[address] ?[place], [place] [address] ?Hours: M-F 8:30 am -12 pm and 1 pm - 2:30 pm ?Phone: [phone] ? ? ?Call your primary care doctor to schedule an appointment to talk about birth control options. ? ?[facility] ?[address] [place] [address] ?Phone: [phone] ?

## 2022-03-24 ENCOUNTER — Telehealth (INDEPENDENT_AMBULATORY_CARE_PROVIDER_SITE_OTHER): Payer: Self-pay | Admitting: Pediatrics

## 2022-03-24 NOTE — Telephone Encounter (Signed)
?  Name of who is calling:Dr.YAYA  ? ?Caller's Relationship to Patient:Doctor  ? ?Best contact number:[phone]  ? ?Provider they see:Dr.Wolfe  ? ?Reason for call:Caller stated that she needed a call back to discuss medication that this patient was prescribed and that it is very important that she speaks with someone soon.  ? ? ? ? ?PRESCRIPTION REFILL ONLY ? ?Name of prescription: ? ?Pharmacy: ? ? ?

## 2022-03-25 NOTE — Telephone Encounter (Signed)
Left voicemail for provider to call back.  ?

## 2022-03-27 NOTE — Telephone Encounter (Signed)
Received call from [facility] and they inform Mary Giles would like to get the Depo shot. They need to know if from a neurological standpoint she is okay to take depo. Informed providers office this would be passed to the provider and once a response is given we would be in touch.  ?

## 2022-03-29 ENCOUNTER — Emergency Department (HOSPITAL_COMMUNITY)
Admission: EM | Admit: 2022-03-29 | Discharge: 2022-03-29 | Disposition: A | Discharge status: Home / Self Care | Payer: Medicaid Other | Attending: Emergency Medicine | Admitting: Emergency Medicine

## 2022-03-29 ENCOUNTER — Other Ambulatory Visit: Payer: Self-pay

## 2022-03-29 ENCOUNTER — Encounter (HOSPITAL_COMMUNITY): Payer: Self-pay

## 2022-03-29 DIAGNOSIS — R300 Dysuria: Secondary | ICD-10-CM | POA: Diagnosis not present

## 2022-03-29 DIAGNOSIS — N938 Other specified abnormal uterine and vaginal bleeding: Secondary | ICD-10-CM | POA: Insufficient documentation

## 2022-03-29 DIAGNOSIS — N939 Abnormal uterine and vaginal bleeding, unspecified: Secondary | ICD-10-CM | POA: Diagnosis present

## 2022-03-29 LAB — BASIC METABOLIC PANEL
Anion gap: 5 (ref 5–15)
BUN: 18 mg/dL (ref 6–20)
CO2: 27 mmol/L (ref 22–32)
Calcium: 9.7 mg/dL (ref 8.9–10.3)
Chloride: 108 mmol/L (ref 98–111)
Creatinine, Ser: 0.61 mg/dL (ref 0.44–1.00)
GFR, Estimated: >60 mL/min (ref >60)
Glucose, Bld: 90 mg/dL (ref 70–99)
Potassium: 3.6 mmol/L (ref 3.5–5.1)
Sodium: 140 mmol/L (ref 135–145)

## 2022-03-29 LAB — CBC
HCT: 40.7 % (ref 36.0–46.0)
Hemoglobin: 13.7 g/dL (ref 12.0–15.0)
MCH: 30.5 pg (ref 26.0–34.0)
MCHC: 33.7 g/dL (ref 30.0–36.0)
MCV: 90.6 fL (ref 80.0–100.0)
Platelets: 222 10*3/uL (ref 150–400)
RBC: 4.49 MIL/uL (ref 3.87–5.11)
RDW: 12.3 % (ref 11.5–15.5)
WBC: 5.2 10*3/uL (ref 4.0–10.5)
nRBC: 0 % (ref 0.0–0.2)

## 2022-03-29 LAB — URINALYSIS, ROUTINE W REFLEX MICROSCOPIC
Bilirubin Urine: NEGATIVE
Glucose, UA: NEGATIVE mg/dL
Hgb urine dipstick: NEGATIVE
Ketones, ur: NEGATIVE mg/dL
Leukocytes,Ua: NEGATIVE
Nitrite: NEGATIVE
Protein, ur: NEGATIVE mg/dL
Specific Gravity, Urine: 1.021 (ref 1.005–1.030)
pH: 6 (ref 5.0–8.0)

## 2022-03-29 LAB — PREGNANCY, URINE: Preg Test, Ur: NEGATIVE

## 2022-03-29 MED ORDER — PHENAZOPYRIDINE HCL 200 MG PO TABS: 200.0000 mg | ORAL_TABLET | Freq: Three times a day (TID) | ORAL | 0 refills | Status: DC

## 2022-03-29 NOTE — ED Provider Triage Note (Signed)
Emergency Medicine Provider Triage Evaluation Note ? ?Mary Giles , a 21 y.o. female  was evaluated in triage.  Pt complains of persistent vaginal bleeding since the beginning of March.  She has been passing some clots at times.  Bleeding is heavy and then becomes lighter and then heavier again.  She has had some burning with urination.  She has had some lightheadedness but no full syncope.  No shortness of breath.  No history of heavy bleeding.  She does not think that she could be pregnant. ? ?Also is requesting behavioral health referrals.  Denies active suicidal ideation. ? ?Review of Systems  ?Positive: Vaginal bleeding, lightheadedness ?Negative: Nausea, vomiting ? ?Physical Exam  ?BP 101/72 (BP Location: Left Arm)   Pulse 77   Temp 98 ?F (36.7 ?C) (Oral)   Resp 16   LMP 02/10/2022 Comment: Period started on the 3/7 now  SpO2 100%  ?Gen:   Awake, no distress   ?Resp:  Normal effort  ?MSK:   Moves extremities without difficulty ?Other:   ? ?Medical Decision Making  ?Medically screening exam initiated at 2:27 PM.  Appropriate orders placed.  Keyani Rigdon was informed that the remainder of the evaluation will be completed by another provider, this initial triage assessment does not replace that evaluation, and the importance of remaining in the ED until their evaluation is complete. ? ? ?  ?Renne Crigler, PA-C ?03/29/22 1428 ? ?

## 2022-03-29 NOTE — ED Provider Notes (Signed)
?[facility]-EMERGENCY DEPT ?Provider Note ? ? ?CSN: [id number] ?Arrival date & time: 03/29/22  1357 ? ?  ? ?History ? ?Chief Complaint  ?Patient presents with  ? Vaginal Bleeding  ? ? ?Mary Giles is a 21 y.o. female. ? ?21 year old female with prior medical history as detailed below presents for evaluation.  Patient reports 2 to 3 weeks of constant vaginal bleeding.  She denies abdominal pain or cramps.  She denies lightheadedness or weakness.  She reports that her periods typically are very regular until the last month. ? ?She also reports some mild intermittent dysuria.  She denies fever.  She denies back pain.  She denies other complaint. ? ?She is accompanied by her father.  She does have a well-established PCP. ? ?The history is provided by the patient and medical records.  ?Vaginal Bleeding ?Quality:  Heavier than menses ?Severity:  Moderate ?Onset quality:  Gradual ?Duration:  3 weeks ?Timing:  Constant ?Progression:  Unchanged ?Chronicity:  New ? ?  ? ?Home Medications ?Prior to Admission medications   ?Medication Sig Start Date End Date Taking? Authorizing Provider  ?phenazopyridine (PYRIDIUM) 200 MG tablet Take 1 tablet (200 mg total) by mouth 3 (three) times daily. 03/29/22  Yes Wynetta Fines, MD  ?brivaracetam (BRIVIACT) 100 MG TABS tablet Take 1 tablet (100 mg total) by mouth 2 (two) times daily. 03/23/22   Margurite Auerbach, MD  ?diazePAM, 15 MG Dose, (VALTOCO 15 MG DOSE) 2 x 7.5 MG/0.1ML LQPK Place 15 mg into the nose as needed (for seizure lasting longer than 5 minutes). 08/13/21   Margurite Auerbach, MD  ?ibuprofen (ADVIL) 400 MG tablet Take 1 tablet (400 mg total) by mouth every 6 (six) hours as needed. ?Patient not taking: Reported on 08/13/2021 05/08/21   Fayrene Helper, PA-C  ?loratadine (CLARITIN) 5 MG chewable tablet Chew 5 mg by mouth daily as needed for allergies.    [provider]  ?phenazopyridine (PYRIDIUM) 200 MG tablet Take 1 tablet (200 mg total) by mouth 3  (three) times daily. 03/10/22   Jeannie Fend, PA-C  ?   ? ?Allergies    ?Keppra [levetiracetam] and Coconut oil   ? ?Review of Systems   ?Review of Systems  ?Genitourinary:  Positive for vaginal bleeding.  ?All other systems reviewed and are negative. ? ?Physical Exam ?Updated Vital Signs ?BP 92/68   Pulse 67   Temp 98 ?F (36.7 ?C) (Oral)   Resp 16   LMP 02/10/2022 Comment: Period started on the 3/7 now  SpO2 100%  ?Physical Exam ?Vitals and nursing note reviewed.  ?Constitutional:   ?   General: She is not in acute distress. ?   Appearance: Normal appearance. She is well-developed.  ?HENT:  ?   Head: Normocephalic and atraumatic.  ?Eyes:  ?   Conjunctiva/sclera: Conjunctivae normal.  ?   Pupils: Pupils are equal, round, and reactive to light.  ?Cardiovascular:  ?   Rate and Rhythm: Normal rate and regular rhythm.  ?   Heart sounds: Normal heart sounds.  ?Pulmonary:  ?   Effort: Pulmonary effort is normal. No respiratory distress.  ?   Breath sounds: Normal breath sounds.  ?Abdominal:  ?   General: There is no distension.  ?   Palpations: Abdomen is soft.  ?   Tenderness: There is no abdominal tenderness.  ?Genitourinary: ?   Comments: Declines pelvic exam ?Musculoskeletal:     ?   General: No deformity. Normal range of motion.  ?  Cervical back: Normal range of motion and neck supple.  ?Skin: ?   General: Skin is warm and dry.  ?Neurological:  ?   General: No focal deficit present.  ?   Mental Status: She is alert and oriented to person, place, and time.  ? ? ?ED Results / Procedures / Treatments   ?Labs ?(all labs ordered are listed, but only abnormal results are displayed) ?Labs Reviewed  ?URINE CULTURE  ?CBC  ?BASIC METABOLIC PANEL  ?URINALYSIS, ROUTINE W REFLEX MICROSCOPIC  ?PREGNANCY, URINE  ?I-STAT BETA HCG BLOOD, ED ([facility], [facility], [facility] ONLY)  ? ? ?EKG ?None ? ?Radiology ?No results found. ? ?Procedures ?Procedures  ? ? ?Medications Ordered in ED ?Medications - No data to display ? ?ED Course/ Medical Decision  Making/ A&P ?  ?                        ?Medical Decision Making ?Amount and/or Complexity of Data Reviewed ?Labs: ordered. ? ?Risk ?Prescription drug management. ? ? ? ?Medical Screen Complete ? ?This patient presented to the ED with complaint of vaginal bleeding, dysuria. ? ?This complaint involves an extensive number of treatment options. The initial differential diagnosis includes, but is not limited to, functional uterine bleeding, UTI, anemia, etc. ? ?This presentation is: Chronic, Self-Limited, Previously Undiagnosed, Uncertain Prognosis, Complicated, Systemic Symptoms, and Threat to Life/Bodily Function ?Patient is presenting with complaint of persistent vaginal bleeding for the last 3 weeks. ? ?She denies symptoms of anemia. ? ?She denies fever, abdominal pain, or other complaint. ? ?She is endorsing some mild dysuria intermittently.  She is otherwise without symptoms suggestive of UTI. ? ?Screening labs obtained are without significant abnormality.  Of note, patient's hemoglobin today is 13.7.  Patient is not pregnant.  Patient's UA is without evidence of UTI ? ?Patient's urine will be cultured. ? ?Patient and patient's father strongly encouraged to follow-up with her PCP in the outpatient setting.  Patient may benefit from further work-up of apparent dysfunctional uterine bleeding.  Patient without indication at this time for treatment of possible UTI. ? ?Additional history obtained: ? ?Additional history obtained from Family ?External records from outside sources obtained and reviewed including prior ED visits and prior Inpatient records.  ? ? ?Lab Tests: ? ?I ordered and personally interpreted labs.  The pertinent results include:  CBC,BMP, HCG, UA ? ?Problem List / ED Course: ? ?Dysfunctional uterine bleeding ? ? ?Reevaluation: ? ?After the interventions noted above, I reevaluated the patient and found that they have: stayed the same ? ? ?Disposition: ? ?After consideration of the diagnostic results  and the patients response to treatment, I feel that the patent would benefit from close outpatient follow-up.  ? ? ? ? ? ? ? ? ?Final Clinical Impression(s) / ED Diagnoses ?Final diagnoses:  ?Dysfunctional uterine bleeding  ?Dysuria  ? ? ?Rx / DC Orders ?ED Discharge Orders   ? ?      Ordered  ?  phenazopyridine (PYRIDIUM) 200 MG tablet  3 times daily       ? 03/29/22 2038  ? ?  ?  ? ?  ? ? ?  ?Wynetta Fines, MD ?03/29/22 2054 ? ?

## 2022-03-29 NOTE — Discharge Instructions (Addendum)
Return for any problem.  ?

## 2022-03-29 NOTE — ED Triage Notes (Signed)
Patient presented to the Ed with excessive vaginal bleeding since the beginning of last month to now. Patient also c/o urinary frequency and pain with urination. ?

## 2022-03-30 NOTE — Telephone Encounter (Signed)
Left voicemail for callback.

## 2022-03-30 NOTE — Telephone Encounter (Signed)
Epilepsy is not a contraindication for depo, so this is fine.  I am concerned about increased depression on depo, this should be discussed prior to giving the shot.  ? ?Lorenz Coaster MD MPH ?

## 2022-03-30 NOTE — Telephone Encounter (Signed)
Dr. Grace Blight called back while Dr. Artis Flock is in a room. Let her know per Dr. Artis Flock  ?"Epilepsy is not a contraindication for depo, so this is fine.  I am concerned about increased depression on depo, this should be discussed prior to giving the shot."  ? ?Dr. Grace Blight states understanding and had no further questions to relay to Dr. Artis Flock.  ?

## 2022-04-01 LAB — URINE CULTURE: Culture: 50000 — AB

## 2022-04-02 ENCOUNTER — Telehealth: Payer: Self-pay

## 2022-04-02 ENCOUNTER — Encounter (INDEPENDENT_AMBULATORY_CARE_PROVIDER_SITE_OTHER): Payer: Self-pay | Admitting: Pediatrics

## 2022-04-02 NOTE — Progress Notes (Signed)
ED Antimicrobial Stewardship Positive Culture Follow Up  ? ?Mary Giles is an 21 y.o. female who presented to [facility] on 03/29/2022 with a chief complaint of  ?Chief Complaint  ?Patient presents with  ? Vaginal Bleeding  ? ? ?Recent Results (from the past 720 hour(s))  ?Urine Culture     Status: None  ? Collection Time: 03/10/22  7:39 PM  ? Specimen: Urine, Clean Catch  ?Result Value Ref Range Status  ? Specimen Description   Final  ?  URINE, CLEAN CATCH ?Performed at [facility], [place] [address], [place], [place] [address] ?  ? Special Requests   Final  ?  NONE ?Performed at [facility], [place] [address] [place], [place] [address] ?  ? Culture   Final  ?  NO GROWTH ?Performed at [facility], [place] [address], [place], [place] [address] ?  ? Report Status 03/12/2022 FINAL  Final  ?Urine Culture     Status: Abnormal  ? Collection Time: 03/29/22  6:59 PM  ? Specimen: Urine, Clean Catch  ?Result Value Ref Range Status  ? Specimen Description   Final  ?  URINE, CLEAN CATCH ?Performed at [facility], [place] [address], [place], [place] [address] ?  ? Special Requests   Final  ?  NONE ?Performed at [facility], [place] [address] [place], [place] [address] ?  ? Culture (A)  Final  ?  50,000 COLONIES/mL KLEBSIELLA PNEUMONIAE ?30,000 COLONIES/mL GROUP B STREP(S.AGALACTIAE)ISOLATED ?TESTING AGAINST S. AGALACTIAE NOT ROUTINELY PERFORMED DUE TO PREDICTABILITY OF AMP/PEN/VAN SUSCEPTIBILITY. ?Performed at [facility], [place] [address] [place], [place] [address] ?  ? Report Status 04/01/2022 FINAL  Final  ? Organism ID, Bacteria KLEBSIELLA PNEUMONIAE (A)  Final  ?    Susceptibility  ? Klebsiella pneumoniae - MIC*  ?  AMPICILLIN RESISTANT Resistant   ?  CEFAZOLIN <=4 SENSITIVE Sensitive   ?  CEFEPIME <=0.12 SENSITIVE Sensitive   ?  CEFTRIAXONE <=0.25 SENSITIVE Sensitive   ?  CIPROFLOXACIN <=0.25 SENSITIVE Sensitive   ?  GENTAMICIN <=1  SENSITIVE Sensitive   ?  IMIPENEM <=0.25 SENSITIVE Sensitive   ?  NITROFURANTOIN 64 INTERMEDIATE Intermediate   ?  TRIMETH/SULFA <=20 SENSITIVE Sensitive   ?  AMPICILLIN/SULBACTAM 4 SENSITIVE Sensitive   ?  PIP/TAZO <=4 SENSITIVE Sensitive   ?  * 50,000 COLONIES/mL KLEBSIELLA PNEUMONIAE  ? ?Insignificant growth. Do not treat.  ? ?ED Provider: Nani Gasser, MD  ? ? ?Jimmy Footman, PharmD, BCPS, BCIDP ?Infectious Diseases Clinical Pharmacist ?Phone: [phone] ?04/02/2022, 8:44 AM ? ? ?

## 2022-04-02 NOTE — Telephone Encounter (Signed)
Post ED Visit - Positive Culture Follow-up ? ?Culture report reviewed by antimicrobial stewardship pharmacist: ?[place] Team ?[]  Elenor Quinones, Pharm.D. ?[]  Heide Guile, Pharm.D., BCPS AQ-ID ?[]  Parks Neptune, Pharm.D., BCPS ?[]  Alycia Rossetti, Pharm.D., BCPS ?[]  [place], Pharm.D., BCPS, AAHIVP ?[]  Legrand Como, Pharm.D., BCPS, AAHIVP ?[]  Salome Arnt, PharmD, BCPS ?[]  Johnnette Gourd, PharmD, BCPS ?[]  Hughes Better, PharmD, BCPS ?[]  Leeroy Cha, PharmD ?[]  Laqueta Linden, PharmD, BCPS ?[]  Albertina Parr, PharmD ? ?[place] Team ?[x]  Jimmy Footman, PharmD ?[]  Lindell Spar, PharmD ?[]  Royetta Asal, PharmD ?[]  Graylin Shiver, Rph ?[]  Rema Fendt) Glennon Mac, PharmD ?[]  Arlyn Dunning, PharmD ?[]  Netta Cedars, PharmD ?[]  Dia Sitter, PharmD ?[]  Leone Haven, PharmD ?[]  Gretta Arab, PharmD ?[]  Theodis Shove, PharmD ?[]  Peggyann Juba, PharmD ?[]  Reuel Boom, PharmD ? ? ?Positive urine culture ?No treatment needed, insignificant growth and no further patient follow-up is required at this time. ? ?Glennon Hamilton ?04/02/2022, 9:47 AM ?  ?

## 2022-04-21 ENCOUNTER — Emergency Department (HOSPITAL_COMMUNITY)
Admission: EM | Admit: 2022-04-21 | Discharge: 2022-04-21 | Disposition: A | Discharge status: Home / Self Care | Payer: Medicaid Other | Attending: Emergency Medicine | Admitting: Emergency Medicine

## 2022-04-21 ENCOUNTER — Emergency Department (HOSPITAL_COMMUNITY): Payer: Medicaid Other

## 2022-04-21 DIAGNOSIS — G40909 Epilepsy, unspecified, not intractable, without status epilepticus: Secondary | ICD-10-CM

## 2022-04-21 DIAGNOSIS — M542 Cervicalgia: Secondary | ICD-10-CM | POA: Insufficient documentation

## 2022-04-21 DIAGNOSIS — R519 Headache, unspecified: Secondary | ICD-10-CM | POA: Diagnosis not present

## 2022-04-21 DIAGNOSIS — R569 Unspecified convulsions: Secondary | ICD-10-CM | POA: Diagnosis present

## 2022-04-21 DIAGNOSIS — N3000 Acute cystitis without hematuria: Secondary | ICD-10-CM | POA: Insufficient documentation

## 2022-04-21 LAB — URINALYSIS, ROUTINE W REFLEX MICROSCOPIC
Bilirubin Urine: NEGATIVE
Glucose, UA: NEGATIVE mg/dL
Hgb urine dipstick: NEGATIVE
Ketones, ur: 20 mg/dL — AB
Nitrite: POSITIVE — AB
Protein, ur: NEGATIVE mg/dL
Specific Gravity, Urine: 1.024 (ref 1.005–1.030)
pH: 5 (ref 5.0–8.0)

## 2022-04-21 LAB — COMPREHENSIVE METABOLIC PANEL
ALT: 16 U/L (ref 0–44)
AST: 18 U/L (ref 15–41)
Albumin: 4.1 g/dL (ref 3.5–5.0)
Alkaline Phosphatase: 50 U/L (ref 38–126)
Anion gap: 6 (ref 5–15)
BUN: 12 mg/dL (ref 6–20)
CO2: 24 mmol/L (ref 22–32)
Calcium: 9.5 mg/dL (ref 8.9–10.3)
Chloride: 110 mmol/L (ref 98–111)
Creatinine, Ser: 0.68 mg/dL (ref 0.44–1.00)
GFR, Estimated: >60 mL/min (ref >60)
Glucose, Bld: 75 mg/dL (ref 70–99)
Potassium: 3.5 mmol/L (ref 3.5–5.1)
Sodium: 140 mmol/L (ref 135–145)
Total Bilirubin: 1.6 mg/dL — ABNORMAL HIGH (ref 0.3–1.2)
Total Protein: 7.3 g/dL (ref 6.5–8.1)

## 2022-04-21 LAB — CBC WITH DIFFERENTIAL/PLATELET
Abs Immature Granulocytes: 0 10*3/uL (ref 0.00–0.07)
Basophils Absolute: 0 10*3/uL (ref 0.0–0.1)
Basophils Relative: 0 %
Eosinophils Absolute: 0.1 10*3/uL (ref 0.0–0.5)
Eosinophils Relative: 1 %
HCT: 40.6 % (ref 36.0–46.0)
Hemoglobin: 13.7 g/dL (ref 12.0–15.0)
Immature Granulocytes: 0 %
Lymphocytes Relative: 26 %
Lymphs Abs: 1.3 10*3/uL (ref 0.7–4.0)
MCH: 29.8 pg (ref 26.0–34.0)
MCHC: 33.7 g/dL (ref 30.0–36.0)
MCV: 88.3 fL (ref 80.0–100.0)
Monocytes Absolute: 0.3 10*3/uL (ref 0.1–1.0)
Monocytes Relative: 6 %
Neutro Abs: 3.5 10*3/uL (ref 1.7–7.7)
Neutrophils Relative %: 67 %
Platelets: 197 10*3/uL (ref 150–400)
RBC: 4.6 MIL/uL (ref 3.87–5.11)
RDW: 11.9 % (ref 11.5–15.5)
WBC: 5.2 10*3/uL (ref 4.0–10.5)
nRBC: 0 % (ref 0.0–0.2)

## 2022-04-21 LAB — I-STAT BETA HCG BLOOD, ED (MC, WL, AP ONLY): I-stat hCG, quantitative: <5 m[IU]/mL (ref <5)

## 2022-04-21 MED ORDER — CEPHALEXIN 500 MG PO CAPS: 500.0000 mg | ORAL_CAPSULE | Freq: Three times a day (TID) | ORAL | 0 refills | Status: DC

## 2022-04-21 MED ORDER — SODIUM CHLORIDE 0.9 % IV SOLN
1.0000 g | Freq: Once | INTRAVENOUS | Status: AC
  Administered 2022-04-21: 1 g via INTRAVENOUS
  Filled 2022-04-21: qty 10

## 2022-04-21 NOTE — ED Provider Notes (Signed)
?[facility]-EMERGENCY DEPT ?Provider Note ? ? ?CSN: [id number] ?Arrival date & time: 04/21/22  1514 ? ?  ? ?History ? ?Chief Complaint  ?Patient presents with  ? Seizures  ? ? ?Mary MeekerClaudia Giles is a 21 y.o. female. ? ?Patient with a history of seizures presenting from grocery store with apparent seizure activity.  She was going to the bathroom for about 10 minutes and found to be seizing on the bathroom floor.  She is given adenosine by EMS and is now awake and alert.  Father reports he witnessed her shaking about her head and her face and arms and legs.  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  She did miss 1 dose 3 days ago.  No fevers or recent illnesses.  No recent cough, cold, congestion, sore throat, chest pain or shortness of breath.  Denies possibility of pregnancy.  Denies alcohol or drug use.  Last seizure was approximately 2 months ago. ?She complains of head and neck pain now believes that she hit her head when she lost consciousness.  Did not bite her tongue or have incontinence.  No focal weakness, numbness or tingling.  Complains of feeling lightheaded currently. ? ?The history is provided by the patient, the EMS personnel and a relative.  ?Seizures ? ?  ? ?Home Medications ?Prior to Admission medications   ?Medication Sig Start Date End Date Taking? Authorizing Provider  ?brivaracetam (BRIVIACT) 100 MG TABS tablet Take 1 tablet (100 mg total) by mouth 2 (two) times daily. 03/23/22   Margurite AuerbachWolfe, Stephanie M, MD  ?diazePAM, 15 MG Dose, (VALTOCO 15 MG DOSE) 2 x 7.5 MG/0.1ML LQPK Place 15 mg into the nose as needed (for seizure lasting longer than 5 minutes). 08/13/21   Margurite AuerbachWolfe, Stephanie M, MD  ?ibuprofen (ADVIL) 400 MG tablet Take 1 tablet (400 mg total) by mouth every 6 (six) hours as needed. ?Patient not taking: Reported on 08/13/2021 05/08/21   Fayrene Helperran, [place], PA-C  ?loratadine (CLARITIN) 5 MG chewable tablet Chew 5 mg by mouth daily as  needed for allergies.    [provider]  ?phenazopyridine (PYRIDIUM) 200 MG tablet Take 1 tablet (200 mg total) by mouth 3 (three) times daily. 03/10/22   Jeannie FendMurphy, Laura A, PA-C  ?phenazopyridine (PYRIDIUM) 200 MG tablet Take 1 tablet (200 mg total) by mouth 3 (three) times daily. 03/29/22   Wynetta FinesMessick, Peter C, MD  ?   ? ?Allergies    ?Keppra [levetiracetam] and Coconut oil   ? ?Review of Systems   ?Review of Systems  ?Constitutional:  Negative for activity change, appetite change and fever.  ?HENT:  Negative for congestion and rhinorrhea.   ?Respiratory:  Negative for cough, chest tightness and shortness of breath.   ?Cardiovascular:  Negative for chest pain.  ?Gastrointestinal:  Negative for abdominal pain, nausea and vomiting.  ?Genitourinary:  Negative for dysuria and vaginal discharge.  ?Musculoskeletal:  Negative for arthralgias and myalgias.  ?Neurological:  Positive for seizures and headaches.  ? all other systems are negative except as noted in the HPI and PMH.  ? ?Physical Exam ?Updated Vital Signs ?BP 101/68   Pulse 69   Temp 98.3 ?F (36.8 ?C) (Oral)   Resp (!) 22   SpO2 100%  ?Physical Exam ?Vitals and nursing note reviewed.  ?Constitutional:   ?   General: She is not in acute distress. ?   Appearance: She is well-developed.  ?HENT:  ?   Head:  Normocephalic and atraumatic.  ?   Mouth/Throat:  ?   Pharynx: No oropharyngeal exudate.  ?Eyes:  ?   Conjunctiva/sclera: Conjunctivae normal.  ?   Pupils: Pupils are equal, round, and reactive to light.  ?Neck:  ?   Comments: No C spine tenderness ?Cardiovascular:  ?   Rate and Rhythm: Normal rate and regular rhythm.  ?   Heart sounds: Normal heart sounds. No murmur heard. ?Pulmonary:  ?   Effort: Pulmonary effort is normal. No respiratory distress.  ?   Breath sounds: Normal breath sounds.  ?Abdominal:  ?   Palpations: Abdomen is soft.  ?   Tenderness: There is no abdominal tenderness. There is no guarding or rebound.  ?Musculoskeletal:     ?   General:  No tenderness. Normal range of motion.  ?   Cervical back: Normal range of motion and neck supple.  ?Skin: ?   General: Skin is warm.  ?Neurological:  ?   Mental Status: She is alert and oriented to person, place, and time.  ?   Cranial Nerves: No cranial nerve deficit.  ?   Motor: No abnormal muscle tone.  ?   Coordination: Coordination normal.  ?   Comments:  5/5 strength throughout. CN 2-12 intact.Equal grip strength.   ?Psychiatric:     ?   Behavior: Behavior normal.  ? ? ?ED Results / Procedures / Treatments   ?Labs ?(all labs ordered are listed, but only abnormal results are displayed) ?Labs Reviewed  ?COMPREHENSIVE METABOLIC PANEL - Abnormal; Notable for the following components:  ?    Result Value  ? Total Bilirubin 1.6 (*)   ? All other components within normal limits  ?URINALYSIS, ROUTINE W REFLEX MICROSCOPIC - Abnormal; Notable for the following components:  ? APPearance HAZY (*)   ? Ketones, ur 20 (*)   ? Nitrite POSITIVE (*)   ? Leukocytes,Ua MODERATE (*)   ? Bacteria, UA MANY (*)   ? All other components within normal limits  ?URINE CULTURE  ?CBC WITH DIFFERENTIAL/PLATELET  ?I-STAT BETA HCG BLOOD, ED ([facility], [facility], [facility] ONLY)  ? ? ?EKG ?EKG Interpretation ? ?Date/Time:  Tuesday Apr 21 2022 16:27:08 EDT ?Ventricular Rate:  76 ?PR Interval:  123 ?QRS Duration: 66 ?QT Interval:  368 ?QTC Calculation: 414 ?R Axis:   73 ?Text Interpretation: Sinus rhythm Nonspecific T wave abnormality Confirmed by Glynn Octave [phone]) on 04/21/2022 4:31:20 PM ? ?Radiology ?CT Head Wo Contrast ? ?Result Date: 04/21/2022 ?CLINICAL DATA:  Head trauma, abnormal mental status (Age 26-64y); Neck trauma, impaired ROM (Age 6-64y) EXAM: CT HEAD WITHOUT CONTRAST CT CERVICAL SPINE WITHOUT CONTRAST TECHNIQUE: Multidetector CT imaging of the head and cervical spine was performed following the standard protocol without intravenous contrast. Multiplanar CT image reconstructions of the cervical spine were also generated. RADIATION DOSE  REDUCTION: This exam was performed according to the departmental dose-optimization program which includes automated exposure control, adjustment of the mA and/or kV according to patient size and/or use of iterative reconstruction technique. COMPARISON:  CT head and cervical spine March 04, 2022 FINDINGS: CT HEAD FINDINGS Brain: No evidence of acute infarction, hemorrhage, hydrocephalus, extra-axial collection or mass lesion/mass effect. Vascular: No hyperdense vessel identified. Skull: No acute fracture. Sinuses/Orbits: Visualized sinuses are clear. No acute findings in the visualized orbits. Other: No mastoid effusions. CT CERVICAL SPINE FINDINGS Alignment: Reversal of the normal cervical lordosis. No substantial sagittal subluxation. Skull base and vertebrae: Vertebral body heights are maintained. No acute fracture Soft tissues and spinal  canal: No prevertebral fluid or swelling. No visible canal hematoma. Disc levels:  No significant focal bony degenerative change. Upper chest: Visualized lung apices are clear. Other: Heterogeneous thyroid gland, which does not appear enlarged. IMPRESSION: 1. No evidence of acute intracranial abnormality. 2. No evidence of acute fracture or traumatic malalignment in the cervical spine. Electronically Signed   By: Feliberto Harts M.D.   On: 04/21/2022 17:13  ? ?CT Cervical Spine Wo Contrast ? ?Result Date: 04/21/2022 ?CLINICAL DATA:  Head trauma, abnormal mental status (Age 94-64y); Neck trauma, impaired ROM (Age 60-64y) EXAM: CT HEAD WITHOUT CONTRAST CT CERVICAL SPINE WITHOUT CONTRAST TECHNIQUE: Multidetector CT imaging of the head and cervical spine was performed following the standard protocol without intravenous contrast. Multiplanar CT image reconstructions of the cervical spine were also generated. RADIATION DOSE REDUCTION: This exam was performed according to the departmental dose-optimization program which includes automated exposure control, adjustment of the mA and/or  kV according to patient size and/or use of iterative reconstruction technique. COMPARISON:  CT head and cervical spine March 04, 2022 FINDINGS: CT HEAD FINDINGS Brain: No evidence of acute infarction, hemorrhage, hydro

## 2022-04-21 NOTE — Discharge Instructions (Addendum)
Continue your seizure medications as prescribed. You do have a urinary tract infection and should take the antibiotics as prescribed.  Follow-up with Dr. Artis Flock this week.  Do not drive or operate heavy machinery.  Return to the ED with new or worsening symptoms. ?

## 2022-04-21 NOTE — ED Notes (Signed)
Walked to bathroom.

## 2022-04-21 NOTE — ED Triage Notes (Addendum)
Patient here from [facility] reporting seizure lasting approx 1 min . Hx of same.  Midazolam 5mg  given in route.  ?

## 2022-04-24 LAB — URINE CULTURE: Culture: >=100000 — AB

## 2022-04-25 ENCOUNTER — Telehealth (HOSPITAL_BASED_OUTPATIENT_CLINIC_OR_DEPARTMENT_OTHER): Payer: Self-pay | Admitting: *Deleted

## 2022-04-25 NOTE — Telephone Encounter (Signed)
Post ED Visit - Positive Culture Follow-up  Culture report reviewed by antimicrobial stewardship pharmacist: [facility] []  , Pharm.D. []  Enzo Bi, Pharm.D., BCPS AQ-ID []  , Pharm.D., BCPS []  Celedonio Miyamoto, Pharm.D., BCPS []  [place], Garvin Fila.D., BCPS, AAHIVP []  , Pharm.D., BCPS, AAHIVP []  Georgina Pillion, PharmD, BCPS []  , PharmD, BCPS []  [place], PharmD, BCPS []  [address], PharmD []  , PharmD, BCPS []  Estella Husk, PharmD  Pharmacy Team []  Lysle Pearl, PharmD []  , PharmD []  Phillips Climes, PharmD []  , Rph []  Agapito Games) , PharmD []  Verlan Friends, PharmD []  , PharmD []  Mervyn Gay, PharmD []  , PharmD []  Vinnie Level, PharmD []  [facility], PharmD []  , PharmD [x]  Len Childs, PharmD   Positive urine culture Treated with Cephalexin, organism sensitive to the same and no further patient follow-up is required at this time.  04/25/2022, 11:52 AM

## 2022-05-04 ENCOUNTER — Encounter (HOSPITAL_COMMUNITY): Payer: Self-pay | Admitting: Pharmacy Technician

## 2022-05-04 ENCOUNTER — Emergency Department (HOSPITAL_COMMUNITY): Payer: Medicaid Other

## 2022-05-04 ENCOUNTER — Emergency Department (HOSPITAL_COMMUNITY)
Admission: EM | Admit: 2022-05-04 | Discharge: 2022-05-04 | Disposition: A | Discharge status: Home / Self Care | Payer: Medicaid Other | Attending: Emergency Medicine | Admitting: Emergency Medicine

## 2022-05-04 ENCOUNTER — Other Ambulatory Visit: Payer: Self-pay

## 2022-05-04 DIAGNOSIS — G40909 Epilepsy, unspecified, not intractable, without status epilepticus: Secondary | ICD-10-CM | POA: Diagnosis not present

## 2022-05-04 DIAGNOSIS — R569 Unspecified convulsions: Secondary | ICD-10-CM | POA: Diagnosis present

## 2022-05-04 DIAGNOSIS — R000 Tachycardia, unspecified: Secondary | ICD-10-CM | POA: Diagnosis not present

## 2022-05-04 LAB — CBC WITH DIFFERENTIAL/PLATELET
Abs Immature Granulocytes: 0.01 10*3/uL (ref 0.00–0.07)
Basophils Absolute: 0.1 10*3/uL (ref 0.0–0.1)
Basophils Relative: 1 %
Eosinophils Absolute: 0.1 10*3/uL (ref 0.0–0.5)
Eosinophils Relative: 1 %
HCT: 44.7 % (ref 36.0–46.0)
Hemoglobin: 14.5 g/dL (ref 12.0–15.0)
Immature Granulocytes: 0 %
Lymphocytes Relative: 19 %
Lymphs Abs: 1.4 10*3/uL (ref 0.7–4.0)
MCH: 29.6 pg (ref 26.0–34.0)
MCHC: 32.4 g/dL (ref 30.0–36.0)
MCV: 91.2 fL (ref 80.0–100.0)
Monocytes Absolute: 0.4 10*3/uL (ref 0.1–1.0)
Monocytes Relative: 5 %
Neutro Abs: 5.7 10*3/uL (ref 1.7–7.7)
Neutrophils Relative %: 74 %
Platelets: 183 10*3/uL (ref 150–400)
RBC: 4.9 MIL/uL (ref 3.87–5.11)
RDW: 11.8 % (ref 11.5–15.5)
WBC: 7.6 10*3/uL (ref 4.0–10.5)
nRBC: 0 % (ref 0.0–0.2)

## 2022-05-04 LAB — COMPREHENSIVE METABOLIC PANEL
ALT: 18 U/L (ref 0–44)
AST: 23 U/L (ref 15–41)
Albumin: 4.2 g/dL (ref 3.5–5.0)
Alkaline Phosphatase: 49 U/L (ref 38–126)
Anion gap: 6 (ref 5–15)
BUN: 13 mg/dL (ref 6–20)
CO2: 23 mmol/L (ref 22–32)
Calcium: 9.5 mg/dL (ref 8.9–10.3)
Chloride: 111 mmol/L (ref 98–111)
Creatinine, Ser: 0.64 mg/dL (ref 0.44–1.00)
GFR, Estimated: >60 mL/min (ref >60)
Glucose, Bld: 87 mg/dL (ref 70–99)
Potassium: 3.6 mmol/L (ref 3.5–5.1)
Sodium: 140 mmol/L (ref 135–145)
Total Bilirubin: 1.7 mg/dL — ABNORMAL HIGH (ref 0.3–1.2)
Total Protein: 7.4 g/dL (ref 6.5–8.1)

## 2022-05-04 LAB — I-STAT BETA HCG BLOOD, ED (MC, WL, AP ONLY): I-stat hCG, quantitative: <5 m[IU]/mL (ref <5)

## 2022-05-04 LAB — URINALYSIS, ROUTINE W REFLEX MICROSCOPIC
Bilirubin Urine: NEGATIVE
Glucose, UA: NEGATIVE mg/dL
Hgb urine dipstick: NEGATIVE
Ketones, ur: NEGATIVE mg/dL
Leukocytes,Ua: NEGATIVE
Nitrite: NEGATIVE
Protein, ur: NEGATIVE mg/dL
Specific Gravity, Urine: 1.027 (ref 1.005–1.030)
pH: 6 (ref 5.0–8.0)

## 2022-05-04 LAB — CBG MONITORING, ED: Glucose-Capillary: 78 mg/dL (ref 70–99)

## 2022-05-04 LAB — RAPID URINE DRUG SCREEN, HOSP PERFORMED
Amphetamines: NOT DETECTED
Barbiturates: NOT DETECTED
Benzodiazepines: POSITIVE — AB
Cocaine: NOT DETECTED
Opiates: NOT DETECTED
Tetrahydrocannabinol: NOT DETECTED

## 2022-05-04 MED ORDER — BRIVARACETAM 100 MG PO TABS: 100.0000 mg | ORAL_TABLET | Freq: Two times a day (BID) | ORAL | Status: DC

## 2022-05-04 MED ORDER — LORAZEPAM 2 MG/ML IJ SOLN
INTRAMUSCULAR | Status: AC
  Filled 2022-05-04: qty 1

## 2022-05-04 MED ORDER — LORAZEPAM 2 MG/ML IJ SOLN: 2.0000 mg | Freq: Once | INTRAMUSCULAR | Status: AC

## 2022-05-04 MED ORDER — BRIVARACETAM 25 MG PO TABS
100.0000 mg | ORAL_TABLET | Freq: Once | ORAL | Status: AC
  Administered 2022-05-04: 100 mg via ORAL

## 2022-05-04 MED ORDER — LACOSAMIDE 50 MG PO TABS: 50.0000 mg | ORAL_TABLET | Freq: Two times a day (BID) | ORAL | 0 refills | Status: DC

## 2022-05-04 MED ORDER — LORAZEPAM 2 MG/ML IJ SOLN
INTRAMUSCULAR | Status: AC
  Administered 2022-05-04: 2 mg via INTRAVENOUS
  Filled 2022-05-04: qty 1

## 2022-05-04 NOTE — ED Notes (Signed)
Walked into room patient was having a having a seizure that presented as jerking with foaming from the mouth. MD is aware and at bedside. Verbal order for 2mg  ativan IV from Dr .

## 2022-05-04 NOTE — ED Provider Notes (Signed)
[facility] DEPT Provider Note   CSN: [id number] Arrival date & time: 05/04/22  1843     History  Chief Complaint  Patient presents with   Seizures    Mary Giles is a 21 y.o. female.   Seizures  21 year old female with a history of seizure disorder presenting to the emergency department after a seizure.  The patient reportedly had a witnessed generalized tonic-clonic seizure, fell and struck her head.  The patient had been administered 5 mg of Versed with EMS.  She arrived via EMS with a c-collar in place and subsequently had another generalized tonic-clonic seizure lasting for minutes in duration that aborted with 2 mg of IV Ativan.  She remained postictal for period of time and subsequently returned to her neurologic baseline.  Family did mention that they were concerned for possible drug use today.   Home Medications Prior to Admission medications   Medication Sig Start Date End Date Taking? Authorizing Provider  brivaracetam (BRIVIACT) 100 MG TABS tablet Take 1 tablet (100 mg total) by mouth 2 (two) times daily. 03/23/22   Rocky Link, MD  cephALEXin (KEFLEX) 500 MG capsule Take 1 capsule (500 mg total) by mouth 3 (three) times daily. 04/21/22   Rancour, Annie Main, MD  diazePAM, 15 MG Dose, (VALTOCO 15 MG DOSE) 2 x 7.5 MG/0.1ML LQPK Place 15 mg into the nose as needed (for seizure lasting longer than 5 minutes). 08/13/21   Rocky Link, MD  ibuprofen (ADVIL) 400 MG tablet Take 1 tablet (400 mg total) by mouth every 6 (six) hours as needed. Patient not taking: Reported on 08/13/2021 05/08/21   Domenic Moras, PA-C  loratadine (CLARITIN) 5 MG chewable tablet Chew 5 mg by mouth daily as needed for allergies.    [provider]  phenazopyridine (PYRIDIUM) 200 MG tablet Take 1 tablet (200 mg total) by mouth 3 (three) times daily. 03/10/22   Tacy Learn, PA-C  phenazopyridine (PYRIDIUM) 200 MG tablet Take 1 tablet (200 mg total) by mouth 3  (three) times daily. 03/29/22   Valarie Merino, MD      Allergies    Keppra [levetiracetam] and Coconut (cocos nucifera)    Review of Systems   Review of Systems  Unable to perform ROS: Mental status change  Neurological:  Positive for seizures.   Physical Exam Updated Vital Signs BP 112/88   Pulse (!) 115   Temp 98.3 F (36.8 C) (Oral)   Resp 14   SpO2 100%  Physical Exam Vitals and nursing note reviewed.  Constitutional:      General: She is in acute distress.     Comments: Actively seizing  HENT:     Head: Normocephalic and atraumatic.  Eyes:     Conjunctiva/sclera: Conjunctivae normal.     Pupils: Pupils are equal, round, and reactive to light.  Neck:     Comments: C-collar in place Cardiovascular:     Rate and Rhythm: Regular rhythm. Tachycardia present.  Pulmonary:     Effort: Respiratory distress present.     Comments: Tachypnea present Abdominal:     General: There is no distension.     Tenderness: There is no guarding.  Musculoskeletal:        General: No deformity or signs of injury.     Cervical back: Neck supple.  Skin:    Findings: No lesion or rash.  Neurological:     Comments: Pupils dilated 5 mm but reactive to light bilaterally, remainder of neurologic  exam deferred as patient actively seizing    ED Results / Procedures / Treatments   Labs (all labs ordered are listed, but only abnormal results are displayed) Labs Reviewed  COMPREHENSIVE METABOLIC PANEL - Abnormal; Notable for the following components:      Result Value   Total Bilirubin 1.7 (*)    All other components within normal limits  RAPID URINE DRUG SCREEN, HOSP PERFORMED - Abnormal; Notable for the following components:   Benzodiazepines POSITIVE (*)    All other components within normal limits  URINE CULTURE  CBC WITH DIFFERENTIAL/PLATELET  URINALYSIS, ROUTINE W REFLEX MICROSCOPIC  CBG MONITORING, ED  I-STAT BETA HCG BLOOD, ED ([facility], [facility], [facility] ONLY)    EKG EKG  Interpretation  Date/Time:  Monday May 04 2022 19:27:11 EDT Ventricular Rate:  79 PR Interval:  121 QRS Duration: 78 QT Interval:  351 QTC Calculation: 403 R Axis:   60 Text Interpretation: Sinus rhythm Borderline T abnormalities, anterior leads Baseline wander in lead(s) V1 Confirmed by Regan Lemming ([id number]) on 05/04/2022 9:02:20 PM  Radiology CT HEAD WO CONTRAST (5MM)  Result Date: 05/04/2022 CLINICAL DATA:  Seizure, hit head EXAM: CT HEAD WITHOUT CONTRAST TECHNIQUE: Contiguous axial images were obtained from the base of the skull through the vertex without intravenous contrast. RADIATION DOSE REDUCTION: This exam was performed according to the departmental dose-optimization program which includes automated exposure control, adjustment of the mA and/or kV according to patient size and/or use of iterative reconstruction technique. COMPARISON:  04/21/2022 FINDINGS: Brain: No acute infarct or hemorrhage. Lateral ventricles and midline structures are unremarkable. No acute extra-axial fluid collections. No mass effect. Vascular: No hyperdense vessel or unexpected calcification. Skull: Normal. Negative for fracture or focal lesion. Sinuses/Orbits: No acute finding. Other: None. IMPRESSION: 1. Stable head CT.  No acute intracranial process. Electronically Signed   By: Randa Ngo M.D.   On: 05/04/2022 20:10   CT Cervical Spine Wo Contrast  Result Date: 05/04/2022 CLINICAL DATA:  Seizure, hit head EXAM: CT CERVICAL SPINE WITHOUT CONTRAST TECHNIQUE: Multidetector CT imaging of the cervical spine was performed without intravenous contrast. Multiplanar CT image reconstructions were also generated. RADIATION DOSE REDUCTION: This exam was performed according to the departmental dose-optimization program which includes automated exposure control, adjustment of the mA and/or kV according to patient size and/or use of iterative reconstruction technique. COMPARISON:  04/21/2022 FINDINGS: Alignment: Alignment is  anatomic. Skull base and vertebrae: No acute fracture. No primary bone lesion or focal pathologic process. Soft tissues and spinal canal: No prevertebral fluid or swelling. No visible canal hematoma. Disc levels:  No significant spondylosis or facet hypertrophy. Upper chest: Airway is patent.  Lung apices are clear. Other: Reconstructed images demonstrate no additional findings. IMPRESSION: 1. No acute cervical spine fracture. Electronically Signed   By: Randa Ngo M.D.   On: 05/04/2022 20:12    Procedures Procedures    Medications Ordered in ED Medications  LORazepam (ATIVAN) injection 2 mg (2 mg Intravenous Given 05/04/22 1930)  brivaracetam (BRIVIACT) tablet 100 mg (100 mg Oral Given 05/04/22 2148)    ED Course/ Medical Decision Making/ A&P                           Medical Decision Making Amount and/or Complexity of Data Reviewed Labs: ordered. Radiology: ordered.  Risk Prescription drug management.    21 year old female with a history of seizure disorder presenting to the emergency department after a seizure.  The patient reportedly  had a witnessed generalized tonic-clonic seizure, fell and struck her head.  The patient had been administered 5 mg of Versed with EMS.  She arrived via EMS with a c-collar in place and subsequently had another generalized tonic-clonic seizure lasting for minutes in duration that aborted with 2 mg of IV Ativan.  She remained postictal for period of time and subsequently returned to her neurologic baseline.  Family did mention that they were concerned for possible drug use today.   Following the patient's seizure activity in the emergency department, she was observed for a total of 4 hours with no recurrent seizure activity.  A UDS was performed and was positive for benzodiazepines status post Ativan administration, no other positive results.  Urinalysis without evidence of UTI, beta-hCG normal, CMP without electrolyte abnormality, normal renal and liver  function, CBC without a leukocytosis or anemia, CBG on arrival was 78.  CT imaging of the head and cervical spine was performed and negative for acute fracture or malalignment of cervical spine, negative for acute intracranial abnormality.  The patient's c-collar was cleared.  EKG revealed no significant abnormal intervals, ventricular rate 79, some baseline wander noted without acute ischemic changes.  The patient was administered her home Briviact 100mg .  I did speak with Dr. Eliberto Ivory of pediatric neurology who recommended the addition of Vimpat 50 mg twice daily in addition to her home AED given her to breakthrough seizures at home.  She did request a level of her Briviact however after discussion with lab I cannot order this at this time.  Following observation in the emergency department, the patient remained back to her baseline mental status with a normal neurologic exam.  Overall stable for discharge with outpatient pediatric neurology follow-up.  Final Clinical Impression(s) / ED Diagnoses Final diagnoses:  Seizure [facility])    Rx / Alexis Orders ED Discharge Orders     None         Regan Lemming, MD 05/04/22 2257

## 2022-05-04 NOTE — Discharge Instructions (Addendum)
Department of Motor Vehicle (DMV) of [place] regulations for seizures - It is the patient's responsibility to report the incidence of the seizure in the state of [place]. [place] has no statutory provision requiring physicians to report patients diagnosed with epilepsy or seizures to a central state agency.  The recommended DMV regulation requirement for a driver in [place] for an individual with a seizure is that they be seizure-free for 6-12 months. However, the DMV may consider the following exceptions to this general rule where: (1) a physician-directed change in medication causes a seizure and the individual immediately resumes the previous therapy which controlled seizures; (2) there is a history of nocturnal seizures or seizures which do not involve loss of consciousness, loss of control of motor function, or loss of appropriate sensation and information process; and (3) an individual has a seizure disorder preceded by an aura (warning) lasting 2-3 minutes. While the DMV may also give consideration to other unusual circumstances which may affect the general requirement that drivers be seizure-free for 6-12 months, interpretation of these circumstances and assignment of restrictions is at the discretion of the Medical Advisor. The DMV also considers compliance with medical therapy essential for safe driving. [The [place] Physician's Guide to Driver Medical Evaluation (June, 1995 ed.)] The Department learns of an individual's condition by inquiring on the application form or renewal form, a physician's report to the DMV, an accident report or from correspondence from the individual. The person may be required to submit a Medical Report Form either annually or semi-annually.  

## 2022-05-04 NOTE — ED Triage Notes (Signed)
Pt bib ems for seizure. On scene, pt still with rhythmic shaking, given 5mg  versed. Pt alert and oriented X4 on arrival. Pt fell from standing and hit her head when seizure started. Seizure lasted 5 minutes. Family concerned of possible drug use today. VSS with EMS. 20g LFA.

## 2022-05-04 NOTE — ED Notes (Signed)
To CT

## 2022-05-05 LAB — URINE CULTURE: Culture: NO GROWTH

## 2022-05-07 ENCOUNTER — Emergency Department (HOSPITAL_COMMUNITY)
Admission: EM | Admit: 2022-05-07 | Discharge: 2022-05-07 | Discharge status: Against Medical Advice | Payer: Medicaid Other | Attending: Emergency Medicine | Admitting: Emergency Medicine

## 2022-05-07 DIAGNOSIS — R569 Unspecified convulsions: Secondary | ICD-10-CM | POA: Diagnosis not present

## 2022-05-07 DIAGNOSIS — Z5321 Procedure and treatment not carried out due to patient leaving prior to being seen by health care provider: Secondary | ICD-10-CM | POA: Insufficient documentation

## 2022-05-07 NOTE — ED Triage Notes (Signed)
Patient presents due to seizure today that lasted about 2 mins. She is now A&O x4. Her last previous episode was 3 days ago.    HX Seizures    EMS vitals: 116/80 BP 95 CBG 95 HR 16 RR 98% SPO2 on room air  97.9 Temp

## 2022-05-07 NOTE — ED Notes (Signed)
Patient left before the triage process could be completed. She refused to wait to see an MD or PA before leaving.

## 2022-06-18 NOTE — Progress Notes (Signed)
Patient: Mary Giles MRN: [id number] Sex: female DOB: 2001/09/20  Provider: Lorenz Coaster, MD Location of Care: [facility]  Note type: Routine follow-up  History of Present Illness:  Mary Giles is a 21 y.o. female with history of generalized epilepsy who I am seeing for routine follow-up. Patient was last seen on 03/23/22 where I continued Briviact and referred to counseling.  Since the last appointment, she has been to the ED 4 times, twice for seizures where I started her on Vimpat 50 mg BID.   Patient presents today with her father. She had a seizure 04/21/22 that they thought was related to a UTI. She had another seizures on 05/04/22 They report that Vimpat made her seizure worse so they stopped this medication. She continues to take the Briviact BID.   Reports that she received her Depo shot right before she had the two break through seizures.   They do not have any more emergency medication.   Patient was interviewed separately, to report panic attacks related to past traumas still affecting her.  Past Medical History Past Medical History:  Diagnosis Date   Seizures (HCC) 04/09/2021    Surgical History Past Surgical History:  Procedure Laterality Date   NO PAST SURGERIES      Family History family history includes Seizures in her father.   Social History Social History   Social History Narrative   Lives at home with mother, father (step-father), 4 siblings (1 sister, 2 brothers). Pets in home include 2 dogs, 1 rabbit, 1 chinchilla, 1 sugar glider.    Graduated in the fall of 22   She is working at [employer] as a Conservation officer, nature.     Allergies Allergies  Allergen Reactions   Keppra [Levetiracetam] Other (See Comments)    Caused VERY, VERY NEGATIVE thoughts   Coconut (Cocos Nucifera) Hives    Medications Current Outpatient Medications on File Prior to Visit  Medication Sig Dispense Refill   DEPO-SUBQ PROVERA 104 104  MG/0.65ML injection SMARTSIG:0.65 Milliliter(s) SUB-Q Every 3 Months     loratadine (CLARITIN) 5 MG chewable tablet Chew 5 mg by mouth daily as needed for allergies.     Loratadine 10 MG CHEW Chew 1 tablet by mouth daily.     EPINEPHrine 0.3 mg/0.3 mL IJ SOAJ injection Inject 0.3 mL as directed as needed INJECT 0.3 MILLILITER (0.3 MG) BY INTRAMUSCULAR ROUTE ONCE AS NEEDED FOR ANAPHYLAXIS (Patient not taking: Reported on 06/22/2022)     ibuprofen (ADVIL) 400 MG tablet Take 1 tablet (400 mg total) by mouth every 6 (six) hours as needed. (Patient not taking: Reported on 08/13/2021) 30 tablet 0   No current facility-administered medications on file prior to visit.   The medication list was reviewed and reconciled. All changes or newly prescribed medications were explained.  A complete medication list was provided to the patient/caregiver.  Physical Exam BP 110/70   Pulse 88   Ht 4' 11.53" (1.512 m)   Wt 87 lb 3.2 oz (39.6 kg)   LMP 06/22/2022   BMI 17.30 kg/m  <1 %ile (Z= -3.17) based on CDC (Girls, 2-20 Years) weight-for-age data using vitals from 06/22/2022.  No results found. General: NAD, well nourished  HEENT: normocephalic, no eye or nose discharge.  MMM  Cardiovascular: warm and well perfused Lungs: Normal work of breathing, no rhonchi or stridor Skin: No birthmarks, no skin breakdown Abdomen: soft, non tender, non distended Extremities: No contractures or edema. Neuro: EOM intact, face symmetric. Moves  all extremities equally and at least antigravity. No abnormal movements. Normal gait.      Diagnosis: 1. Epilepsy, generalized, convulsive (HCC)      Assessment and Plan [name] is a 21 y.o. female with history of generalized epilepsy who I am seeing in follow-up. Patient had two break through seizure events after which she was started on Vimpat which the family reports made seizure events worse and stopped giving this medication. Since stopping she has not had any further  break though events. Given the timing of her receiving her Depo shot, it is likely that the changes in her hormones triggered her seizures. Recommended continuing on current medication dosage for now however if she plans to change her birth control again, plan to provide Klonopin in the short term to assist with shifts in hormones. Refilled emergency medication for any breakthrough events.  Patient was interviewed separately, to report panic attacks related to past traumas still affecting her. I recommended counseling and provided information on [facility].   - Recommended continuing Briviact, dose can be time adjusted as long as she continues to take this twice a day  - Plan to add Klonopin if there are any changes with her birth control medication planned - Provided information on [facility].  - Family asked about epi pen for which I recommended follow up with PCP.   I spent 40 minutes on day of service on this patient including review of chart, discussion with patient and family, discussion of screening results, coordination with other providers and management of orders and paperwork.     Return in about 6 months (around 12/23/2022).  I, Mayra Reel, scribed for and in the presence of Lorenz Coaster, MD at today's visit on 06/22/2022.   I, Lorenz Coaster MD MPH, personally performed the services described in this documentation, as scribed by Mayra Reel in my presence on 06/22/2022 and it is accurate, complete, and reviewed by me.    Lorenz Coaster MD MPH Neurology and Neurodevelopment [facility]  [address] [place], [place], [place] [address] Phone: [phone] Fax: [phone]

## 2022-06-22 ENCOUNTER — Encounter (INDEPENDENT_AMBULATORY_CARE_PROVIDER_SITE_OTHER): Payer: Self-pay | Admitting: Pediatrics

## 2022-06-22 ENCOUNTER — Ambulatory Visit (INDEPENDENT_AMBULATORY_CARE_PROVIDER_SITE_OTHER): Payer: Medicaid Other | Admitting: Pediatrics

## 2022-06-22 VITALS — BP 110/70 | HR 88 | Ht 59.53 in | Wt 87.2 lb

## 2022-06-22 DIAGNOSIS — G40309 Generalized idiopathic epilepsy and epileptic syndromes, not intractable, without status epilepticus: Secondary | ICD-10-CM

## 2022-06-22 DIAGNOSIS — F411 Generalized anxiety disorder: Secondary | ICD-10-CM

## 2022-06-22 DIAGNOSIS — G40919 Epilepsy, unspecified, intractable, without status epilepticus: Secondary | ICD-10-CM

## 2022-06-22 MED ORDER — BRIVARACETAM 100 MG PO TABS: 100.0000 mg | ORAL_TABLET | Freq: Two times a day (BID) | ORAL | 5 refills | Status: DC

## 2022-06-22 MED ORDER — VALTOCO 15 MG DOSE 7.5 MG/0.1ML NA LQPK: 15.0000 mg | NASAL | 2 refills | Status: DC | PRN

## 2022-06-22 NOTE — Progress Notes (Signed)
Last seizure last month went to ER-

## 2022-06-22 NOTE — Progress Notes (Deleted)
SHe reports panic attacks when when having sexual intercourse.  Sent a counselor to her house, recommended seeing a counselor.    Think one of them related to stress.

## 2022-06-22 NOTE — Patient Instructions (Addendum)
You can change the time where you take Briviact, but change it slowly.  Let me know if you plan on changing her birth control again, I want to give you a second medication to prevent seizures when your hormones are changing.  Go to her primary care doctor to ask them about getting another epi pen.

## 2022-06-29 ENCOUNTER — Telehealth (INDEPENDENT_AMBULATORY_CARE_PROVIDER_SITE_OTHER): Payer: Self-pay | Admitting: Pediatrics

## 2022-06-29 NOTE — Telephone Encounter (Signed)
I talked with Mary Sherron Flemings NP with [facility]. She said that Mary Giles is having vaginal bleeding and that she is going to give her Provera for 5-10 days and refer her to gynecology. She said that Mary Giles's Dad told her that Dr Artis Flock planned to do a different seizure medicine while she transitions to a new contraceptive. I explained that Dr Artis Flock is with a patient at the time of the call but that I will relay the message so Dr Artis Flock can call Mary Giles and her father directly to make a seizure medication treatment plan. Mary Grace Blight agreed with this plan. TG

## 2022-06-29 NOTE — Telephone Encounter (Signed)
  Name of who is calling:NP YAYA   Caller's Relationship to Patient:Doctor   Best contact number:[phone]  Provider they see:Dr.Wolfe   Reason for call:Caller requested a call back from Dr. Artis Flock regarding medical concerns she has and wanted to follow up about medical concerns      PRESCRIPTION REFILL ONLY  Name of prescription:  Pharmacy:

## 2022-06-30 NOTE — Telephone Encounter (Signed)
  Name of who is calling: Milbert Coulter  Caller's Relationship to Patient: Father   Best contact number: [phone]  Provider they see: Dr. Artis Flock  Reason for call: Dad is calling to do a follow up on an appt. He states that his daughter did not receive her DEPO shot because her cycle ha been on for 3 months. He wanted to know if they would have to give her her something else to ease her seizures      PRESCRIPTION REFILL ONLY  Name of prescription:   Pharmacy:

## 2022-07-02 ENCOUNTER — Encounter (INDEPENDENT_AMBULATORY_CARE_PROVIDER_SITE_OTHER): Payer: Self-pay | Admitting: Pediatrics

## 2022-07-02 MED ORDER — CLONAZEPAM 0.5 MG PO TABS: 0.5000 mg | ORAL_TABLET | Freq: Two times a day (BID) | ORAL | 0 refills | Status: DC

## 2022-07-02 NOTE — Addendum Note (Signed)
Addended by: Margurite Auerbach on: 07/02/2022 07:52 AM   Modules accepted: Orders

## 2022-07-02 NOTE — Telephone Encounter (Signed)
Prescription sent for Klonopin TID while she changes her birth control.  I will send a message through mychart given the DPR issue.    Lorenz Coaster MD MPH

## 2022-07-05 ENCOUNTER — Encounter (INDEPENDENT_AMBULATORY_CARE_PROVIDER_SITE_OTHER): Payer: Self-pay | Admitting: Pediatrics

## 2022-07-22 ENCOUNTER — Emergency Department (HOSPITAL_COMMUNITY)
Admission: EM | Admit: 2022-07-22 | Discharge: 2022-07-23 | Disposition: A | Discharge status: Home / Self Care | Payer: Medicaid Other | Attending: Emergency Medicine | Admitting: Emergency Medicine

## 2022-07-22 ENCOUNTER — Encounter (HOSPITAL_COMMUNITY): Payer: Self-pay

## 2022-07-22 ENCOUNTER — Other Ambulatory Visit: Payer: Self-pay

## 2022-07-22 DIAGNOSIS — N939 Abnormal uterine and vaginal bleeding, unspecified: Secondary | ICD-10-CM | POA: Diagnosis not present

## 2022-07-22 DIAGNOSIS — Z711 Person with feared health complaint in whom no diagnosis is made: Secondary | ICD-10-CM | POA: Insufficient documentation

## 2022-07-22 DIAGNOSIS — R102 Pelvic and perineal pain: Secondary | ICD-10-CM | POA: Diagnosis present

## 2022-07-22 LAB — CBC WITH DIFFERENTIAL/PLATELET
Abs Immature Granulocytes: 0 10*3/uL (ref 0.00–0.07)
Basophils Absolute: 0 10*3/uL (ref 0.0–0.1)
Basophils Relative: 1 %
Eosinophils Absolute: 0.1 10*3/uL (ref 0.0–0.5)
Eosinophils Relative: 3 %
HCT: 38.7 % (ref 36.0–46.0)
Hemoglobin: 12.9 g/dL (ref 12.0–15.0)
Immature Granulocytes: 0 %
Lymphocytes Relative: 44 %
Lymphs Abs: 2 10*3/uL (ref 0.7–4.0)
MCH: 29.8 pg (ref 26.0–34.0)
MCHC: 33.3 g/dL (ref 30.0–36.0)
MCV: 89.4 fL (ref 80.0–100.0)
Monocytes Absolute: 0.4 10*3/uL (ref 0.1–1.0)
Monocytes Relative: 8 %
Neutro Abs: 2.1 10*3/uL (ref 1.7–7.7)
Neutrophils Relative %: 44 %
Platelets: 211 10*3/uL (ref 150–400)
RBC: 4.33 MIL/uL (ref 3.87–5.11)
RDW: 12.5 % (ref 11.5–15.5)
WBC: 4.6 10*3/uL (ref 4.0–10.5)
nRBC: 0 % (ref 0.0–0.2)

## 2022-07-22 LAB — WET PREP, GENITAL
Clue Cells Wet Prep HPF POC: NONE SEEN
Sperm: NONE SEEN
Trich, Wet Prep: NONE SEEN
WBC, Wet Prep HPF POC: <10 (ref <10)
Yeast Wet Prep HPF POC: NONE SEEN

## 2022-07-22 LAB — COMPREHENSIVE METABOLIC PANEL
ALT: 14 U/L (ref 0–44)
AST: 18 U/L (ref 15–41)
Albumin: 4.2 g/dL (ref 3.5–5.0)
Alkaline Phosphatase: 49 U/L (ref 38–126)
Anion gap: 7 (ref 5–15)
BUN: 17 mg/dL (ref 6–20)
CO2: 24 mmol/L (ref 22–32)
Calcium: 9 mg/dL (ref 8.9–10.3)
Chloride: 110 mmol/L (ref 98–111)
Creatinine, Ser: 0.87 mg/dL (ref 0.44–1.00)
GFR, Estimated: >60 mL/min (ref >60)
Glucose, Bld: 94 mg/dL (ref 70–99)
Potassium: 3.8 mmol/L (ref 3.5–5.1)
Sodium: 141 mmol/L (ref 135–145)
Total Bilirubin: 0.9 mg/dL (ref 0.3–1.2)
Total Protein: 7.3 g/dL (ref 6.5–8.1)

## 2022-07-22 LAB — URINALYSIS, ROUTINE W REFLEX MICROSCOPIC
Bacteria, UA: NONE SEEN
Bilirubin Urine: NEGATIVE
Glucose, UA: NEGATIVE mg/dL
Ketones, ur: NEGATIVE mg/dL
Nitrite: NEGATIVE
Protein, ur: 30 mg/dL — AB
Specific Gravity, Urine: 1.026 (ref 1.005–1.030)
pH: 7 (ref 5.0–8.0)

## 2022-07-22 LAB — CBG MONITORING, ED: Glucose-Capillary: 89 mg/dL (ref 70–99)

## 2022-07-22 LAB — I-STAT BETA HCG BLOOD, ED (MC, WL, AP ONLY): I-stat hCG, quantitative: <5 m[IU]/mL (ref <5)

## 2022-07-22 NOTE — ED Notes (Signed)
Patient still unresponsive to pain. Father in room. Left pupil unresponsive to light, right pupil responsive to light

## 2022-07-22 NOTE — ED Notes (Signed)
Pt now awake and talking, A&Ox4, denies any pain.

## 2022-07-22 NOTE — ED Notes (Signed)
IV started. Blood collected. Suction in room. Patient is still unresponsive.

## 2022-07-22 NOTE — ED Provider Triage Note (Addendum)
Emergency Medicine Provider Triage Evaluation Note  Letishia Elliott , a 21 y.o. female  was evaluated in triage.  Pt complains of stuck tampon as of last night.  She states that she replaced her tampon department to bed last night and has not been able to retrieve it since.  She notes some associated vaginal bleeding, discharge and abdominal cramping and dysuria.  Denies fever, chills, night sweats, nausea, vomiting, change in bowel habits.  Review of Systems  Positive: See above Negative:   Physical Exam  BP 107/69   Pulse 83   Temp 98.7 F (37.1 C) (Oral)   Resp 16   Ht 4\' 11"  (1.499 m)   Wt 40.8 kg   LMP 07/22/2022   SpO2 97%   BMI 18.18 kg/m  Gen:   Awake, no distress   Resp:  Normal effort  MSK:   Moves extremities without difficulty  Other:    Medical Decision Making  Medically screening exam initiated at 7:52 PM.  Appropriate orders placed.  Tea Collums was informed that the remainder of the evaluation will be completed by another provider, this initial triage assessment does not replace that evaluation, and the importance of remaining in the ED until their evaluation is complete.  Patient is requesting a female for the pelvic exam.   [name], PA 07/22/22 1953    07/24/22, Peter Garter 07/22/22 [phone]

## 2022-07-22 NOTE — ED Notes (Signed)
Pt transported to ed room 23. Oxygen applied

## 2022-07-22 NOTE — ED Notes (Signed)
Shaking noted and when suctioned pt doesn't try to bite with no gag reflux. Patient still unresponsive to pain

## 2022-07-22 NOTE — ED Triage Notes (Addendum)
Pt reports unable to get tampon out since last night. Pt reports increase in vaginal bleeding today, increase in abd cramping,  and burning with urination.

## 2022-07-22 NOTE — ED Notes (Signed)
Pelvic exam ready at bedside. ?

## 2022-07-22 NOTE — Discharge Instructions (Signed)
We evaluated you in the emergency department today for possible retained tampon.  We performed a pelvic exam, but did not see a tampon in your vagina.  We sent some other testing such as for gonorrhea and chlamydia which will not come back today.   Your other testing was negative.  Please follow-up closely with your primary doctor.  You can also follow-up with an OB/GYN.  Please return to the emergency department if you develop any fevers, vomiting, loss of consciousness, worsening abdominal pain, painful urination, or any other concerning symptoms.

## 2022-07-22 NOTE — ED Notes (Addendum)
Father comes out of room and states daughter is currently having a seziure. Grand-mal. Father states they normally last 5 mins. Patient is still unresponsive not responding to painful stimuli.  PA aware.

## 2022-07-23 LAB — GC/CHLAMYDIA PROBE AMP (~~LOC~~) NOT AT ARMC
Chlamydia: NEGATIVE
Comment: NEGATIVE
Comment: NORMAL
Neisseria Gonorrhea: NEGATIVE

## 2022-07-23 NOTE — ED Notes (Signed)
I provided reinforced discharge education based off of after visit summary/care provided. Pt acknowledged and understood my education. Pt had no further questions/concerns for provider/myself. After visit summary provided to pt. 

## 2022-07-23 NOTE — ED Provider Notes (Signed)
[facility]-EMERGENCY DEPT Provider Note  CSN: [id number] Arrival date & time: 07/22/22 1841  Chief Complaint(s) Foreign Body in Vagina  HPI Mary Giles is a 21 y.o. female presenting to the emergency department with concern for retained vaginal foreign body.  Patient reports she is on her period, she thinks she put a tampon in last night, but this morning could not find it.  She reports mild lower abdominal/pelvic discomfort.  No dysuria.  No fevers or chills.  No nausea, vomiting, abdominal pain.   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 Authorizing Provider  brivaracetam (BRIVIACT) 100 MG TABS tablet Take 1 tablet (100 mg total) by mouth 2 (two) times daily. 06/22/22   Margurite Auerbach, MD  clonazePAM (KLONOPIN) 0.5 MG tablet Take 1 tablet (0.5 mg total) by mouth 2 (two) times daily. For antiepileptic bridge while in between birth control 07/02/22   Margurite Auerbach, MD  DEPO-SUBQ PROVERA 104 104 MG/0.65ML injection SMARTSIG:0.65 Milliliter(s) SUB-Q Every 3 Months 03/31/22   [provider]  diazePAM, 15 MG Dose, (VALTOCO 15 MG DOSE) 2 x 7.5 MG/0.1ML LQPK Place 15 mg into the nose as needed (for seizure lasting longer than 5 minutes). 06/22/22   Margurite Auerbach, MD  EPINEPHrine 0.3 mg/0.3 mL IJ SOAJ injection Inject 0.3 mL as directed as needed INJECT 0.3 MILLILITER (0.3 MG) BY INTRAMUSCULAR ROUTE ONCE AS NEEDED  FOR ANAPHYLAXIS Patient not taking: Reported on 06/22/2022 08/26/21   [provider]  ibuprofen (ADVIL) 400 MG tablet Take 1 tablet (400 mg total) by mouth every 6 (six) hours as needed. Patient not taking: Reported on 08/13/2021 05/08/21   Fayrene Helper, PA-C  loratadine (CLARITIN) 5 MG chewable tablet Chew 5 mg by mouth daily as needed for allergies.    [provider]  Loratadine 10 MG CHEW Chew 1 tablet by mouth daily.    [provider]                                                                                                                                    Past Surgical History Past Surgical History:  Procedure Laterality Date   NO PAST SURGERIES     Family History Family History  Problem Relation Age of Onset   Seizures Father     Social History Social History   Tobacco Use  Smoking status: Never   Smokeless tobacco: Never  Vaping Use   Vaping Use: Never used  Substance Use Topics   Alcohol use: No   Drug use: Never   Allergies Keppra [levetiracetam], Coconut (cocos nucifera), Coconut fatty acids, and Other  Review of Systems Review of Systems  All other systems reviewed and are negative.   Physical Exam Vital Signs  I have reviewed the triage vital signs BP 100/67   Pulse 66   Temp 98.2 F (36.8 C) (Oral)   Resp 20   Ht 4\' 11"  (1.499 m)   Wt 40.8 kg   LMP 07/22/2022   SpO2 99%   BMI 18.18 kg/m  Physical Exam Vitals and nursing note reviewed.  Constitutional:      General: She is not in acute distress.    Appearance: She is well-developed.  HENT:     Head: Normocephalic and atraumatic.     Mouth/Throat:     Mouth: Mucous membranes are moist.  Eyes:     Pupils: Pupils are equal, round, and reactive to light.  Cardiovascular:     Rate and Rhythm: Normal rate and regular rhythm.     Heart sounds: No murmur heard. Pulmonary:     Effort: Pulmonary effort is normal. No respiratory distress.     Breath sounds: Normal  breath sounds.  Abdominal:     General: Abdomen is flat.     Palpations: Abdomen is soft.     Tenderness: There is no abdominal tenderness.  Genitourinary:    Comments: Chaperoned by 07/24/2022.  Pelvic exam with blood from cervical os, no evidence of retained tampon, bimanual exam with further no evidence of retained tampon.  Bimanual exam with no masses.  No cervical tenderness. Musculoskeletal:        General: No tenderness.     Right lower leg: No edema.     Left lower leg: No edema.  Skin:    General: Skin is warm and dry.  Neurological:     General: No focal deficit present.     Mental Status: She is alert. Mental status is at baseline.  Psychiatric:        Mood and Affect: Mood normal.        Behavior: Behavior normal.     ED Results and Treatments Labs (all labs ordered are listed, but only abnormal results are displayed) Labs Reviewed  URINALYSIS, ROUTINE W REFLEX MICROSCOPIC - Abnormal; Notable for the following components:      Result Value   APPearance CLOUDY (*)    Hgb urine dipstick MODERATE (*)    Protein, ur 30 (*)    Leukocytes,Ua TRACE (*)    All other components within normal limits  WET PREP, GENITAL  COMPREHENSIVE METABOLIC PANEL  CBC WITH DIFFERENTIAL/PLATELET  CBG MONITORING, ED  I-STAT BETA HCG BLOOD, ED ([facility], [facility], [facility] ONLY)  GC/CHLAMYDIA PROBE AMP ([place]) NOT AT [facility]  Radiology No results found.  Pertinent labs & imaging results that were available during my care of the patient were reviewed by me and considered in my medical decision making (see MDM for details).  Medications Ordered in ED Medications - No data to display                                                                                                                                   Procedures Procedures  (including critical care time)  Medical  Decision Making / ED Course   MDM:  21 year old female presenting to the emergency department for concern for retained foreign body.  Notably, patient had a seizure while in the triage room.  I did not witness the seizure.  This reportedly lasted 5 minutes.  Patient has chronic epilepsy and reports that she has seizures 4-5 times a year.  The patient's father, was present and witnessed the seizure, and reports that this is her typical seizure.  It was reportedly [place].  Patient returned to baseline and denied any lingering symptoms such as headache, fevers, chills, or any other concerning symptoms.  Pelvic exam performed without evidence of foreign body.  Pelvic exam otherwise reassuring without cervical motion tenderness.  Given some lower abdominal tenderness, will send gonorrhea and Chlamydia testing.  Wet prep unremarkable.  Advised close follow-up with primary physician. Will discharge patient to home. All questions answered. Patient comfortable with plan of discharge. Return precautions discussed with patient and specified on the after visit summary.        Additional history obtained: -Additional history obtained from father -External records from outside source obtained and reviewed including: Chart review including previous notes, labs, imaging, consultation notes   Lab Tests: -I ordered, reviewed, and interpreted labs.   The pertinent results include:   Labs Reviewed  URINALYSIS, ROUTINE W REFLEX MICROSCOPIC - Abnormal; Notable for the following components:      Result Value   APPearance CLOUDY (*)    Hgb urine dipstick MODERATE (*)    Protein, ur 30 (*)    Leukocytes,Ua TRACE (*)    All other components within normal limits  WET PREP, GENITAL  COMPREHENSIVE METABOLIC PANEL  CBC WITH DIFFERENTIAL/PLATELET  CBG MONITORING, ED  I-STAT BETA HCG BLOOD, ED ([facility], [facility], [facility] ONLY)  GC/CHLAMYDIA PROBE AMP ([place]) NOT AT [facility]      EKG   EKG  Interpretation  Date/Time:    Ventricular Rate:    PR Interval:    QRS Duration:   QT Interval:    QTC Calculation:   R Axis:     Text Interpretation:             Medicines ordered and prescription drug management: No orders of the defined types were placed in this encounter.   -I have reviewed the patients home medicines and have made adjustments as needed  Reevaluation: After the interventions noted above, I reevaluated  the patient and found that they have :improved  Co morbidities that complicate the patient evaluation  Past Medical History:  Diagnosis Date   Seizures (HCC) 04/09/2021      Dispostion: Discharge     Final Clinical Impression(s) / ED Diagnoses Final diagnoses:  Feared complaint without diagnosis  Vaginal bleeding     This chart was dictated using voice recognition software.  Despite best efforts to proofread,  errors can occur which can change the documentation meaning.    Lonell Grandchild, MD 07/23/22 [phone]

## 2022-08-11 ENCOUNTER — Emergency Department (HOSPITAL_COMMUNITY)
Admission: EM | Admit: 2022-08-11 | Discharge: 2022-08-11 | Discharge status: Against Medical Advice | Payer: Medicaid Other

## 2022-08-11 NOTE — ED Provider Triage Note (Signed)
Emergency Medicine Provider Triage Evaluation Note  Mary Giles , a 21 y.o. female  was evaluated in triage.  Pt complains of seizure. Patient notes she had a seizure. History of epilepsy. Compliant with her medications. No other complaints.  Review of Systems  Positive: seizure Negative: CP  Physical Exam  LMP 07/22/2022  Gen:   Awake, no distress   Resp:  Normal effort  MSK:   Moves extremities without difficulty  Other:  AAOx4. Normal speech.   Medical Decision Making  Medically screening exam initiated at 5:51 PM.  Appropriate orders placed.  Bijou Easler was informed that the remainder of the evaluation will be completed by another provider, this initial triage assessment does not replace that evaluation, and the importance of remaining in the ED until their evaluation is complete.  Seizure   Mannie Stabile, [place] 08/11/22 1752

## 2022-08-11 NOTE — ED Triage Notes (Signed)
Pt BIB EMS from home. Pt was going for a walk and reports feeling an aura. Unsure if pt had seizure. Pt was not post-ictal upon EMS arrival. Denies LOC. Hx of seizures.

## 2022-08-11 NOTE — ED Notes (Signed)
Pt left before triage could be completed.

## 2022-09-24 NOTE — Telephone Encounter (Signed)
error 

## 2022-10-09 ENCOUNTER — Encounter (INDEPENDENT_AMBULATORY_CARE_PROVIDER_SITE_OTHER): Payer: Self-pay

## 2022-10-09 ENCOUNTER — Encounter (INDEPENDENT_AMBULATORY_CARE_PROVIDER_SITE_OTHER): Payer: Self-pay | Admitting: Pediatrics

## 2022-10-15 ENCOUNTER — Telehealth (INDEPENDENT_AMBULATORY_CARE_PROVIDER_SITE_OTHER): Payer: Self-pay | Admitting: Pediatrics

## 2022-10-15 NOTE — Telephone Encounter (Signed)
  Name of who is calling: Oswaldo Done  Caller's Relationship to Patient: Dad  Best contact number: [phone]  Provider they see: Dr. Artis Flock  Reason for call: Dad called and stated Shatoya will not take her medication. Dad would like to speak with provider. He's requesting a callback.      PRESCRIPTION REFILL ONLY  Name of prescription: BRIVIACT  Pharmacy:

## 2022-10-15 NOTE — Telephone Encounter (Signed)
Contacted pt's father.  I informed dad that due HIPAA laws, I am unable to release any medical information to him.  Dad asked that we hear him out. Dad states the following:  The patient isn't taking her medication as directed. He states that she is taking her medication once a day not twice a day.  Dad also stated that pt told him that we changed her appointment, dad does not believe that we changed her appointment. He believes that she cancelled the appointment and she will not reschedule. Dad states that she is deliberately skipping her appointment with Dr, Artis Flock.   Dad is asking Dr. Artis Flock to please call the patient and express the importance of taking her medication as prescribed and rescheduling an appointment with Dr. Artis Flock.   Dad thanked [place] for listening.   SS, CCMA

## 2022-10-19 ENCOUNTER — Encounter (INDEPENDENT_AMBULATORY_CARE_PROVIDER_SITE_OTHER): Payer: Self-pay

## 2022-10-19 NOTE — Telephone Encounter (Signed)
Mychart message sent to [facility] regarding scheduling and compliance. Did not reference father's call to maintain confidentiality.  Lorenz Coaster MD MPH

## 2022-10-23 ENCOUNTER — Telehealth (INDEPENDENT_AMBULATORY_CARE_PROVIDER_SITE_OTHER): Payer: Self-pay | Admitting: Pediatrics

## 2022-10-23 NOTE — Telephone Encounter (Signed)
  Name of who is calling: Pema  Caller's Relationship to Patient: self  Best contact number: [phone]  Provider they see: Dr. Artis Flock  Reason for call: She was wondering if she needed to schedule an appt w. Dr. Artis Flock first since she has decided to keep her baby. Needs meds for her pregnancy to avoid any seizures since this is a high risk pregnancy.  Please contact back     PRESCRIPTION REFILL ONLY  Name of prescription:  Pharmacy:

## 2022-10-23 NOTE — Telephone Encounter (Signed)
  Name of who is calling: Legend  Caller's Relationship to Patient: self  Best contact number: [phone]  Provider they see: Artis Flock  Reason for call: She is currently pregnant and she needs a change in her meds since this is a high risk pregnancy. Would like a call back from Dr. Artis Flock.      PRESCRIPTION REFILL ONLY  Name of prescription:  Pharmacy:

## 2022-10-26 ENCOUNTER — Ambulatory Visit (INDEPENDENT_AMBULATORY_CARE_PROVIDER_SITE_OTHER): Payer: Medicaid Other

## 2022-10-26 VITALS — Ht 59.0 in | Wt 88.9 lb

## 2022-10-26 DIAGNOSIS — Z3201 Encounter for pregnancy test, result positive: Secondary | ICD-10-CM

## 2022-10-26 DIAGNOSIS — N912 Amenorrhea, unspecified: Secondary | ICD-10-CM

## 2022-10-26 LAB — POCT URINE PREGNANCY: Preg Test, Ur: POSITIVE — AB

## 2022-10-26 MED ORDER — VITAFOL GUMMIES 3.33-0.333-34.8 MG PO CHEW: 3.0000 | CHEWABLE_TABLET | Freq: Every day | ORAL | 11 refills | Status: DC

## 2022-10-26 NOTE — Telephone Encounter (Signed)
I was not aware the patient was pregnant.  The medication she is on is thought to be safe for pregnancy, but I would like to see her as soon as possible.  Please stress she can not miss doses of medication while pregnant, for risk of seizure affecting the baby.   Please schedule at next available appointment.   Lorenz Coaster MD MPH

## 2022-10-26 NOTE — Telephone Encounter (Signed)
Last seen 06/22/22

## 2022-10-26 NOTE — Progress Notes (Signed)
Mary Giles presents today for UPT. She has no unusual complaints. LMP: 09/22/22 Patient is [redacted]w[redacted]d with an EDD 06/29/23    OBJECTIVE: Appears well, in no apparent distress.  OB History     Gravida  1   Para      Term      Preterm      AB      Living         SAB      IAB      Ectopic      Multiple      Live Births             Home UPT Result: Positive In-Office UPT result: Positive I have reviewed the patient's medical, obstetrical, social, and family histories, and medications.   ASSESSMENT: Positive pregnancy test  PLAN Prenatal care to be completed at: [facility] Please schedule for New OB Intake and New OB Provider visit Prenatal vitamins sent to pharmacy

## 2022-10-27 NOTE — Telephone Encounter (Signed)
Attempted to contact pt. Unable to be reached. LVM to call us back.  SS, CCMA

## 2022-11-12 ENCOUNTER — Telehealth (INDEPENDENT_AMBULATORY_CARE_PROVIDER_SITE_OTHER): Payer: Self-pay | Admitting: Pediatrics

## 2022-11-12 ENCOUNTER — Emergency Department (HOSPITAL_COMMUNITY)
Admission: EM | Admit: 2022-11-12 | Discharge: 2022-11-12 | Discharge status: Against Medical Advice | Payer: Medicaid Other | Attending: Emergency Medicine | Admitting: Emergency Medicine

## 2022-11-12 ENCOUNTER — Emergency Department (HOSPITAL_COMMUNITY): Payer: Medicaid Other

## 2022-11-12 DIAGNOSIS — Z20822 Contact with and (suspected) exposure to covid-19: Secondary | ICD-10-CM | POA: Insufficient documentation

## 2022-11-12 DIAGNOSIS — Z3A01 Less than 8 weeks gestation of pregnancy: Secondary | ICD-10-CM | POA: Insufficient documentation

## 2022-11-12 DIAGNOSIS — R1084 Generalized abdominal pain: Secondary | ICD-10-CM | POA: Insufficient documentation

## 2022-11-12 DIAGNOSIS — G40309 Generalized idiopathic epilepsy and epileptic syndromes, not intractable, without status epilepticus: Secondary | ICD-10-CM

## 2022-11-12 DIAGNOSIS — O219 Vomiting of pregnancy, unspecified: Secondary | ICD-10-CM | POA: Diagnosis present

## 2022-11-12 DIAGNOSIS — R569 Unspecified convulsions: Secondary | ICD-10-CM | POA: Diagnosis not present

## 2022-11-12 DIAGNOSIS — Z5321 Procedure and treatment not carried out due to patient leaving prior to being seen by health care provider: Secondary | ICD-10-CM | POA: Insufficient documentation

## 2022-11-12 LAB — CBC WITH DIFFERENTIAL/PLATELET
Abs Immature Granulocytes: 0.01 10*3/uL (ref 0.00–0.07)
Basophils Absolute: 0 10*3/uL (ref 0.0–0.1)
Basophils Relative: 0 %
Eosinophils Absolute: 0 10*3/uL (ref 0.0–0.5)
Eosinophils Relative: 0 %
HCT: 37.7 % (ref 36.0–46.0)
Hemoglobin: 12.6 g/dL (ref 12.0–15.0)
Immature Granulocytes: 0 %
Lymphocytes Relative: 19 %
Lymphs Abs: 1.1 10*3/uL (ref 0.7–4.0)
MCH: 29.6 pg (ref 26.0–34.0)
MCHC: 33.4 g/dL (ref 30.0–36.0)
MCV: 88.7 fL (ref 80.0–100.0)
Monocytes Absolute: 0.5 10*3/uL (ref 0.1–1.0)
Monocytes Relative: 9 %
Neutro Abs: 4.1 10*3/uL (ref 1.7–7.7)
Neutrophils Relative %: 72 %
Platelets: 226 10*3/uL (ref 150–400)
RBC: 4.25 MIL/uL (ref 3.87–5.11)
RDW: 12.3 % (ref 11.5–15.5)
WBC: 5.8 10*3/uL (ref 4.0–10.5)
nRBC: 0 % (ref 0.0–0.2)

## 2022-11-12 LAB — COMPREHENSIVE METABOLIC PANEL
ALT: 26 U/L (ref 0–44)
AST: 21 U/L (ref 15–41)
Albumin: 4 g/dL (ref 3.5–5.0)
Alkaline Phosphatase: 45 U/L (ref 38–126)
Anion gap: 6 (ref 5–15)
BUN: 11 mg/dL (ref 6–20)
CO2: 24 mmol/L (ref 22–32)
Calcium: 9.4 mg/dL (ref 8.9–10.3)
Chloride: 107 mmol/L (ref 98–111)
Creatinine, Ser: 0.55 mg/dL (ref 0.44–1.00)
GFR, Estimated: >60 mL/min (ref >60)
Glucose, Bld: 83 mg/dL (ref 70–99)
Potassium: 3.6 mmol/L (ref 3.5–5.1)
Sodium: 137 mmol/L (ref 135–145)
Total Bilirubin: 1.1 mg/dL (ref 0.3–1.2)
Total Protein: 7.2 g/dL (ref 6.5–8.1)

## 2022-11-12 LAB — I-STAT BETA HCG BLOOD, ED (MC, WL, AP ONLY): I-stat hCG, quantitative: >2000 m[IU]/mL — ABNORMAL HIGH (ref <5)

## 2022-11-12 LAB — RESP PANEL BY RT-PCR (FLU A&B, COVID) ARPGX2
Influenza A by PCR: NEGATIVE
Influenza B by PCR: NEGATIVE
SARS Coronavirus 2 by RT PCR: NEGATIVE

## 2022-11-12 LAB — CBG MONITORING, ED
Glucose-Capillary: 69 mg/dL — ABNORMAL LOW (ref 70–99)
Glucose-Capillary: 76 mg/dL (ref 70–99)

## 2022-11-12 MED ORDER — BRIVARACETAM 100 MG PO TABS: 100.0000 mg | ORAL_TABLET | Freq: Two times a day (BID) | ORAL | 2 refills | Status: DC

## 2022-11-12 NOTE — Telephone Encounter (Signed)
Prescription refills sent to [facility].   Please contact family to let them know prescription has been filled.  We had attempted to schedule an appointment earlier than February.  Please schedule ASAP.   Lorenz Coaster MD MPH

## 2022-11-12 NOTE — ED Provider Triage Note (Signed)
Emergency Medicine Provider Triage Evaluation Note  Mary Giles , a 21 y.o. female  was evaluated in triage.  Pt complains of seizure around 6AM. Took her meds this AM. Hx of seizures. Reports father witnessed seizure, reports it lasted 1.5 hrs. Reports vomiting with seizures.  Review of Systems  Positive: Abdominal pain, vomiting Negative: confusion  Physical Exam  BP (!) 110/58 (BP Location: Left Arm)   Pulse 87   Temp 98.3 F (36.8 C) (Oral)   Resp 18   LMP 09/22/2022   SpO2 99%  Gen:   Awake, no distress   Resp:  Normal effort  MSK:   Moves extremities without difficulty  Other:  Diffuse abdominal pain w/o guarding  Medical Decision Making  Medically screening exam initiated at 8:30 AM.  Appropriate orders placed.  Karuna Balducci was informed that the remainder of the evaluation will be completed by another provider, this initial triage assessment does not replace that evaluation, and the importance of remaining in the ED until their evaluation is complete.     Pete Pelt, [place] 11/12/22 [phone]

## 2022-11-12 NOTE — ED Triage Notes (Signed)
Patient here from home reporting seizure activity this am. [redacted] weeks pregnant. Denies hitting head, no injury.

## 2022-11-12 NOTE — ED Notes (Signed)
Cbg-[id number] PA aware. Orange juice, crackers, and peanut butter provided.

## 2022-11-12 NOTE — Telephone Encounter (Signed)
  Name of who is calling: Oswaldo Done   Caller's Relationship to Patient: dad  Best contact number: [phone]  Provider they see: Artis Flock  Reason for call:  Kamry has misplaced her prescription and unable to find it. Dad wants to know if they can get some more pills since he knows she doesn't have any refills left. Please inform of next steps. She is currently admitted into the hospital     PRESCRIPTION REFILL ONLY  Name of prescription: brivaracetam (BRIVIACT) 100 MG TABS tablet     Pharmacy:

## 2022-11-12 NOTE — Telephone Encounter (Signed)
Attempted to contact pt.  Unable to be reached. LVM to call our office back.  Due to HIPAA, I cannot call dad and give information.   SS, CCMA

## 2022-11-19 ENCOUNTER — Telehealth (INDEPENDENT_AMBULATORY_CARE_PROVIDER_SITE_OTHER): Payer: Self-pay

## 2022-11-19 DIAGNOSIS — G40309 Generalized idiopathic epilepsy and epileptic syndromes, not intractable, without status epilepticus: Secondary | ICD-10-CM

## 2022-11-19 NOTE — Telephone Encounter (Signed)
Dad called in asking if we could release the hold on her medication. Pharmacy is stating that it is too early to fill patients BRIVIACT.  SS, CCMA

## 2022-11-19 NOTE — Telephone Encounter (Signed)
Attempted to contact the patient twice. Was sent to voicemail both times.   Contacted pt's dad and asked him to have her call our office back.   Due to HIPAA I am unable to release information to dad.   SS, CCMA

## 2022-11-19 NOTE — Telephone Encounter (Signed)
Contacted pt's pharmacy. Verified pt's name and DOB.  I explained the situation to the pharmacist and asked the pharmacist if they could release the hold.   The pharmacist stated that they would release the hold, however the patient's insurance will not cover the medication until the 23rd.   The pharmacist stated that If we would like to send it a partial RX they would have to order it and it probably wont be in until the 17th.   6 tablets with a discount card would be $10.  SS, CCMA

## 2022-11-20 MED ORDER — BRIVARACETAM 100 MG PO TABS: 100.0000 mg | ORAL_TABLET | Freq: Two times a day (BID) | ORAL | 0 refills | Status: DC

## 2022-11-20 NOTE — Addendum Note (Signed)
Addended by: Margurite Auerbach on: 11/20/2022 01:15 PM   Modules accepted: Orders

## 2022-11-20 NOTE — Telephone Encounter (Signed)
Partial fill for 6 tablets has been sent to her pharmacy.    Lorenz Coaster MD MPH

## 2022-12-03 ENCOUNTER — Encounter: Payer: Self-pay | Admitting: Obstetrics and Gynecology

## 2022-12-03 ENCOUNTER — Ambulatory Visit (INDEPENDENT_AMBULATORY_CARE_PROVIDER_SITE_OTHER): Payer: Medicaid Other

## 2022-12-03 VITALS — BP 108/70 | HR 81 | Ht 59.0 in | Wt 95.9 lb

## 2022-12-03 DIAGNOSIS — Z3A09 9 weeks gestation of pregnancy: Secondary | ICD-10-CM

## 2022-12-03 DIAGNOSIS — Z1332 Encounter for screening for maternal depression: Secondary | ICD-10-CM

## 2022-12-03 DIAGNOSIS — O3680X0 Pregnancy with inconclusive fetal viability, not applicable or unspecified: Secondary | ICD-10-CM | POA: Diagnosis not present

## 2022-12-03 DIAGNOSIS — Z3490 Encounter for supervision of normal pregnancy, unspecified, unspecified trimester: Secondary | ICD-10-CM | POA: Insufficient documentation

## 2022-12-03 DIAGNOSIS — Z3481 Encounter for supervision of other normal pregnancy, first trimester: Secondary | ICD-10-CM

## 2022-12-03 MED ORDER — BLOOD PRESSURE KIT DEVI: 1.0000 | 0 refills | Status: AC

## 2022-12-03 MED ORDER — GOJJI WEIGHT SCALE MISC: 1.0000 | 0 refills | Status: AC

## 2022-12-03 NOTE — Progress Notes (Signed)
New OB Intake  I connected with Mary Giles on 12/03/22 at 10:15 AM EST by in person  and verified that I am speaking with the correct person using two identifiers. Nurse is located at [facility] and pt is located at [place].  I discussed the limitations, risks, security and privacy concerns of performing an evaluation and management service by telephone and the availability of in person appointments. I also discussed with the patient that there may be a patient responsible charge related to this service. The patient expressed understanding and agreed to proceed.  I explained I am completing New OB Intake today. We discussed EDD of 06/29/23 that is based on LMP of 09/22/22. Pt is G2/P0010. I reviewed her allergies, medications, Medical/Surgical/OB history, and appropriate screenings. I informed her of [facility] services. [facility] information placed in AVS. Based on history, this is a low risk pregnancy.  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 Patient given information for [facility] website for more information about [facility]. Patient is interested in water birth. Offered upcoming OB visit with CNM to discuss further.  MyChart/Babyscripts MyChart access verified. I explained pt will have some visits in office and some virtually. Babyscripts instructions given and order placed. Patient verifies receipt of registration text/e-mail. Account successfully created and app downloaded.  Blood Pressure Cuff/Weight Scale Blood pressure cuff ordered for  patient to pick-up from [employer]. Explained after first prenatal appt pt will check weekly and document in Babyscripts. Patient does not have weight scale; order sent to [facility], patient may track weight weekly in Babyscripts.  Anatomy US Explained first scheduled [place] will be around 19 weeks. Dating and viability u/s performed today. Anatomy US TBD.  Labs Discussed Avelina Laine genetic screening with patient. Would like both Panorama and Horizon drawn at new OB visit. Routine prenatal labs needed.  COVID Vaccine Patient has had COVID vaccine.   Is patient a CenteringPregnancy candidate?  Declined Declined due to Group setting Not a candidate due to  If accepted,   Social Determinants of Health Food Insecurity: Patient denies food insecurity. WIC Referral: Patient is interested in referral to [facility].  Transportation: Patient denies transportation needs. Childcare: Discussed no children allowed at ultrasound appointments. Offered childcare services; patient declines childcare services at this time.  First visit review I reviewed new OB appt with patient. I explained they will have a provider visit that includes prenatal labs, std screening, genetic screening, and discuss plan of care for pregnancy. Explained pt will be seen by Raelyn Mora at first visit; encounter routed to appropriate provider. Explained that patient will be seen by pregnancy navigator following visit with provider.   Hamilton Capri, RN 12/03/2022  10:05 AM

## 2022-12-04 NOTE — Progress Notes (Signed)
Patient: Mary Giles MRN: [id number] Sex: female DOB: February 27, 2001  Provider: Carylon Perches, MD Location of Care: [facility]  Note type: Routine follow-up  History of Present Illness:  Mary Giles is a 21 y.o. female with history of generalized epilepsy who I am seeing for routine follow-up. Patient was last seen on 06/22/22 where I continued Briviact and recommended adding a second medication in the case that she switches birth control.  Since the last appointment, the patient reached out on 07/02/22 to report stopping her Depo shot, for which I prescribed low dose Klonopin for 1 mo.   She was seen in the ED on 07/22/22 for concern for retained foreign body, however, while in the ED was reported to have a seizure. Dad called on 10/15/22 to note that he feared patient was missing doses of medication. Patient also reported she was pregnant on 10/23/22 and wondered about continuing medication while pregnant. Despite multiple attempts we have not been able to communicate with Mary Giles about her medications directly. She was last seen by OBGYN 12/03/22.   Patient presents today with her father. She reports that other than the seizure in the hospital in September she had one event that she felt coming on however, she reports she laid down and then it did not progress. She reports some trouble taking the medication due to nausea related to taking it.   She reports that she did run out of medication for a little and missed 4 days of medication. However, she did not have any breakthrough events during this time.   Patient History:  Seizure semiology: Prodrome prior to seizure. Staring in the distance and was not responding. Her whole body "froze" and she could not move or talk.  No tonic or clonic movements, no atonia. No lip smacking or tongue thrusting. No bowel or bladder incontinence.   Diagnostics:  EEG 06/2011.  Unable to see results.    rEEG 09/17/2016:   Impression: This is a abnormal record with the patient in awake and drowsy states due to rare frontal discharges and rythmic high amplitude slow waves that appear to be frontal in origin.  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     Previous AEDS: Trileptal (worsened seizures), Keppra (emotionality, SI), Lamictal (never picked up)  Past Medical History Past Medical History:  Diagnosis Date   Seizures ([place]) 04/09/2021    Surgical History Past Surgical History:  Procedure Laterality Date   NO PAST SURGERIES      Family History family history includes Seizures in her father.   Social History Social History   Social History Narrative   Lives at home with mother, father (step-father), 4 siblings (1 sister, 2 brothers). Pets in home include 2 dogs, 1 rabbit, 1 chinchilla, 1 sugar glider.    Graduated in the fall of 22        Allergies Allergies  Allergen Reactions   Keppra [Levetiracetam] Other (See Comments)    Caused VERY, VERY NEGATIVE thoughts   Coconut (Cocos Nucifera) Hives   Coconut Fatty Acids     Other Reaction(s) from [employer]: Hives, Difficulty swallowing, Anaphylaxis   Other Hives    coconut    Medications Current Outpatient Medications on File Prior to Visit  Medication Sig Dispense Refill   Blood Pressure Monitoring (BLOOD PRESSURE KIT) DEVI 1 kit by Does not apply route once a week. 1 each 0   diazePAM, 15 MG Dose, (VALTOCO 15 MG DOSE)  2 x 7.5 MG/0.1ML LQPK Place 15 mg into the nose as needed (for seizure lasting longer than 5 minutes). (Patient not taking: Reported on 12/13/2022) 2 each 2   EPINEPHrine 0.3 mg/0.3 mL IJ SOAJ injection      Loratadine 10 MG CHEW Chew 1 tablet by mouth daily. (Patient not taking: Reported on 12/09/2022)     Misc. Devices (GOJJI WEIGHT SCALE) MISC 1 Device by Does not apply route every 30 (thirty) days. (Patient not taking: Reported on 12/09/2022) 1 each 0   Prenatal Vit-Fe  Phos-FA-Omega (VITAFOL GUMMIES) 3.33-0.333-34.8 MG CHEW Chew 3 tablets by mouth daily. (Patient not taking: Reported on 12/09/2022) 90 tablet 11   No current facility-administered medications on file prior to visit.   The medication list was reviewed and reconciled. All changes or newly prescribed medications were explained.  A complete medication list was provided to the patient/caregiver.  Physical Exam BP 100/80 (BP Location: Left Arm, Patient Position: Sitting, Cuff Size: Small)   Pulse 90   Ht 4' 11.65" (1.515 m)   Wt 95 lb 3.2 oz (43.2 kg)   LMP 09/22/2022   BMI 18.81 kg/m  1 %ile (Z= -2.25) based on CDC (Girls, 2-20 Years) weight-for-age data using vitals from 12/09/2022.  No results found. Gen: well appearing teen Skin: No rash, No neurocutaneous stigmata. HEENT: Normocephalic, no dysmorphic features, no conjunctival injection, nares patent, mucous membranes moist, oropharynx clear. Neck: Supple, no meningismus. No focal tenderness. Resp: Clear to auscultation bilaterally CV: Regular rate, normal S1/S2, no murmurs, no rubs Abd: BS present, abdomen soft, non-tender, non-distended. No hepatosplenomegaly or mass Ext: Warm and well-perfused. No deformities, no muscle wasting, ROM full.  Neurological Examination: MS: Awake, alert, interactive. Normal eye contact, answered the questions appropriately for age, speech was fluent,  Normal comprehension.  Attention and concentration were normal. Cranial Nerves: Pupils were equal and reactive to light;  normal fundoscopic exam with sharp discs, visual field full with confrontation test; EOM normal, no nystagmus; no ptsosis, no double vision, intact facial sensation, face symmetric with full strength of facial muscles, hearing intact to finger rub bilaterally, palate elevation is symmetric, tongue protrusion is symmetric with full movement to both sides.  Sternocleidomastoid and trapezius are with normal strength. Motor-Normal tone throughout,  Normal strength in all muscle groups. No abnormal movements Reflexes- Reflexes 2+ and symmetric in the biceps, triceps, patellar and achilles tendon. Plantar responses flexor bilaterally, no clonus noted Sensation: Intact to light touch throughout.  Romberg negative. Coordination: No dysmetria on FTN test. No difficulty with balance when standing on one foot bilaterally.   Gait: Normal gait. Tandem gait was normal. Was able to perform toe walking and heel walking without difficulty.    Diagnosis: 1. Epilepsy, generalized, convulsive (Mary Giles)   2. Psychogenic nonepileptic seizure   3. Pregnancy, unspecified gestational age      Assessment and Plan Mary Giles is a 21 y.o. female with history of generalized epilepsy who I am seeing in follow-up. Patients seizures are well controlled with consistent medication adherence. Stressed the importance of medication compliance while pregnant. She asked about switching back to Keppra, however, I informed her that I do not want to changed any medications while she is pregnant as a breakthrough event would be dangerous for her and the baby. Discussed referral to adult neurology once she has her baby, patient in agreement.   - Continue Briviact.   I spent 20 minutes on day of service on this patient including review of chart, discussion with  patient and family, discussion of screening results, coordination with other providers and management of orders and paperwork.     Return in about 7 months (around 07/10/2023).  I, Scharlene Gloss, scribed for and in the presence of Carylon Perches, MD at today's visit on 12/09/2022.   I, Carylon Perches MD MPH, personally performed the services described in this documentation, as scribed by Scharlene Gloss in my presence on 12/09/2022 and it is accurate, complete, and reviewed by me.    Carylon Perches MD MPH [facility]  [address], [place], [place] [address] Phone: [phone] Fax: [phone]

## 2022-12-09 ENCOUNTER — Ambulatory Visit (INDEPENDENT_AMBULATORY_CARE_PROVIDER_SITE_OTHER): Payer: Medicaid Other | Admitting: Pediatrics

## 2022-12-09 ENCOUNTER — Encounter (INDEPENDENT_AMBULATORY_CARE_PROVIDER_SITE_OTHER): Payer: Self-pay | Admitting: Pediatrics

## 2022-12-09 VITALS — BP 100/80 | HR 90 | Ht 59.65 in | Wt 95.2 lb

## 2022-12-09 DIAGNOSIS — F445 Conversion disorder with seizures or convulsions: Secondary | ICD-10-CM | POA: Diagnosis not present

## 2022-12-09 DIAGNOSIS — G40309 Generalized idiopathic epilepsy and epileptic syndromes, not intractable, without status epilepticus: Secondary | ICD-10-CM

## 2022-12-09 DIAGNOSIS — Z3490 Encounter for supervision of normal pregnancy, unspecified, unspecified trimester: Secondary | ICD-10-CM

## 2022-12-09 MED ORDER — BRIVARACETAM 100 MG PO TABS: 100.0000 mg | ORAL_TABLET | Freq: Two times a day (BID) | ORAL | 6 refills | Status: DC

## 2022-12-09 NOTE — Patient Instructions (Signed)
It is very important that you continue to take all your medication doses, seizures can be dangerous for your baby. This medication is safe for you during pregnancy and for your baby.  We will talk about transferring you to an adult doctor at the next visit.

## 2022-12-13 ENCOUNTER — Other Ambulatory Visit: Payer: Self-pay

## 2022-12-13 ENCOUNTER — Encounter (HOSPITAL_COMMUNITY): Payer: Self-pay | Admitting: Obstetrics & Gynecology

## 2022-12-13 ENCOUNTER — Inpatient Hospital Stay (HOSPITAL_COMMUNITY)
Admission: AD | Admit: 2022-12-13 | Discharge: 2022-12-13 | Disposition: A | Discharge status: Home / Self Care | Payer: Medicaid Other | Attending: Obstetrics & Gynecology | Admitting: Obstetrics & Gynecology

## 2022-12-13 ENCOUNTER — Inpatient Hospital Stay (HOSPITAL_COMMUNITY): Payer: Medicaid Other

## 2022-12-13 DIAGNOSIS — O021 Missed abortion: Secondary | ICD-10-CM

## 2022-12-13 DIAGNOSIS — N939 Abnormal uterine and vaginal bleeding, unspecified: Secondary | ICD-10-CM | POA: Diagnosis not present

## 2022-12-13 DIAGNOSIS — O0480 (Induced) termination of pregnancy with unspecified complications: Secondary | ICD-10-CM | POA: Diagnosis not present

## 2022-12-13 DIAGNOSIS — Z3A11 11 weeks gestation of pregnancy: Secondary | ICD-10-CM | POA: Diagnosis not present

## 2022-12-13 DIAGNOSIS — R109 Unspecified abdominal pain: Secondary | ICD-10-CM | POA: Diagnosis present

## 2022-12-13 DIAGNOSIS — R1030 Lower abdominal pain, unspecified: Secondary | ICD-10-CM

## 2022-12-13 DIAGNOSIS — Z332 Encounter for elective termination of pregnancy: Secondary | ICD-10-CM

## 2022-12-13 LAB — CBC WITH DIFFERENTIAL/PLATELET
Abs Immature Granulocytes: 0.03 10*3/uL (ref 0.00–0.07)
Basophils Absolute: 0 10*3/uL (ref 0.0–0.1)
Basophils Relative: 1 %
Eosinophils Absolute: 0.1 10*3/uL (ref 0.0–0.5)
Eosinophils Relative: 2 %
HCT: 35.6 % — ABNORMAL LOW (ref 36.0–46.0)
Hemoglobin: 12.5 g/dL (ref 12.0–15.0)
Immature Granulocytes: 0 %
Lymphocytes Relative: 12 %
Lymphs Abs: 1.1 10*3/uL (ref 0.7–4.0)
MCH: 31 pg (ref 26.0–34.0)
MCHC: 35.1 g/dL (ref 30.0–36.0)
MCV: 88.3 fL (ref 80.0–100.0)
Monocytes Absolute: 0.6 10*3/uL (ref 0.1–1.0)
Monocytes Relative: 7 %
Neutro Abs: 6.7 10*3/uL (ref 1.7–7.7)
Neutrophils Relative %: 78 %
Platelets: 221 10*3/uL (ref 150–400)
RBC: 4.03 MIL/uL (ref 3.87–5.11)
RDW: 13.2 % (ref 11.5–15.5)
WBC: 8.6 10*3/uL (ref 4.0–10.5)
nRBC: 0 % (ref 0.0–0.2)

## 2022-12-13 LAB — HCG, QUANTITATIVE, PREGNANCY: hCG, Beta Chain, Quant, S: 111989 m[IU]/mL — ABNORMAL HIGH (ref <5)

## 2022-12-13 LAB — ABO/RH: ABO/RH(D): O POS

## 2022-12-13 MED ORDER — FENTANYL CITRATE (PF) 100 MCG/2ML IJ SOLN
12.5000 ug | Freq: Once | INTRAMUSCULAR | Status: AC
  Administered 2022-12-13: 12.5 ug via INTRAMUSCULAR
  Filled 2022-12-13: qty 2

## 2022-12-13 MED ORDER — IBUPROFEN 800 MG PO TABS: 800.0000 mg | ORAL_TABLET | Freq: Three times a day (TID) | ORAL | 0 refills | Status: AC | PRN

## 2022-12-13 MED ORDER — FENTANYL CITRATE (PF) 100 MCG/2ML IJ SOLN: 12.5000 ug | Freq: Once | INTRAMUSCULAR | Status: DC

## 2022-12-13 NOTE — MAU Provider Note (Cosign Needed Addendum)
MAU Provider Note  History  [id number]  Arrival date and time: 12/13/22 1503   Chief Complaint  Patient presents with   Abdominal Pain     HPI Mary Giles is a 22 y.o. G2P0010 at [redacted]w[redacted]d by LMP with PMHx notable for recent iatrogenic abortion, who presents for severe bleeding abdominal pain post abortion.  Patient noted significant abdominal pain and yellow discharge after taking Cytotec 800 mg around 1 PM today.  Had initially noted no bleeding, however patient was in the MAU waiting room bathroom and had passed bright red blood and fetal products. These were collected and sent to path. Pt notes she was not aware that she would bleed or have abd pain after taking cytotec. She received her care at [facility] choice of [place].  Patient's father is at bedside.  Vaginal bleeding: Yes LOF: No Fetal Movement: No Contractions: Yes  --/--/O POS (01/07 1819)  OB History     Gravida  2   Para      Term      Preterm      AB  1   Living         SAB      IAB  1   Ectopic      Multiple      Live Births              Past Medical History:  Diagnosis Date   Epilepsy (HCC)    Seizures ([facility]) 04/09/2021    Past Surgical History:  Procedure Laterality Date   NO PAST SURGERIES      Family History  Problem Relation Age of Onset   Seizures Father     Social History   Socioeconomic History   Marital status: Single    Spouse name: Not on file   Number of children: 0   Years of education: Not on file   Highest education level: 10th grade  Occupational History   Not on file  Tobacco Use   Smoking status: Never   Smokeless tobacco: Never  Vaping Use   Vaping Use: Never used  Substance and Sexual Activity   Alcohol use: No   Drug use: Never   Sexual activity: Yes    Partners: Male    Birth control/protection: None  Other Topics Concern   Not on file  Social History Narrative   Lives at home with mother, father (step-father), 4 siblings (1 sister, 2  brothers). Pets in home include 2 dogs, 1 rabbit, 1 chinchilla, 1 sugar glider.    Graduated in the fall of 22       Social Determinants of Health   Financial Resource Strain: Not on file  Food Insecurity: Not on file  Transportation Needs: Not on file  Physical Activity: Not on file  Stress: Not on file  Social Connections: Not on file  Intimate Partner Violence: Not on file    Allergies  Allergen Reactions   Keppra [Levetiracetam] Other (See Comments)    Caused VERY, VERY NEGATIVE thoughts   Coconut (Cocos Nucifera) Hives   Coconut Fatty Acids     Other Reaction(s) from [employer]: Hives, Difficulty swallowing, Anaphylaxis   Other Hives    coconut    No current facility-administered medications on file prior to encounter.   Current Outpatient Medications on File Prior to Encounter  Medication Sig Dispense Refill   brivaracetam (BRIVIACT) 100 MG TABS tablet Take 1 tablet (100 mg total) by mouth 2 (two) times daily. 62 tablet 6  Blood Pressure Monitoring (BLOOD PRESSURE KIT) DEVI 1 kit by Does not apply route once a week. 1 each 0   DEPO-SUBQ PROVERA 104 104 MG/0.65ML injection  (Patient not taking: Reported on 12/09/2022)     diazePAM, 15 MG Dose, (VALTOCO 15 MG DOSE) 2 x 7.5 MG/0.1ML LQPK Place 15 mg into the nose as needed (for seizure lasting longer than 5 minutes). (Patient not taking: Reported on 12/13/2022) 2 each 2   EPINEPHrine 0.3 mg/0.3 mL IJ SOAJ injection      ibuprofen (ADVIL) 400 MG tablet Take 1 tablet (400 mg total) by mouth every 6 (six) hours as needed. (Patient not taking: Reported on 12/09/2022) 30 tablet 0   Loratadine 10 MG CHEW Chew 1 tablet by mouth daily. (Patient not taking: Reported on 12/09/2022)     Misc. Devices (GOJJI WEIGHT SCALE) MISC 1 Device by Does not apply route every 30 (thirty) days. (Patient not taking: Reported on 12/09/2022) 1 each 0   Prenatal Vit-Fe Phos-FA-Omega (VITAFOL GUMMIES) 3.33-0.333-34.8 MG CHEW Chew 3 tablets by mouth daily.  (Patient not taking: Reported on 12/09/2022) 90 tablet 11     Review of Systems  Constitutional:  Negative for appetite change, chills and fever.  HENT:  Negative for congestion, facial swelling and postnasal drip.   Eyes:  Negative for photophobia.  Respiratory:  Negative for cough and shortness of breath.   Cardiovascular:  Negative for chest pain.  Gastrointestinal:  Positive for abdominal pain. Negative for nausea and vomiting.  Endocrine: Negative for polyuria.  Genitourinary:  Positive for pelvic pain, vaginal bleeding and vaginal discharge. Negative for flank pain.  Musculoskeletal:  Negative for arthralgias.  Skin:  Negative for rash.  Neurological:  Negative for weakness and light-headedness.  Psychiatric/Behavioral:  Negative for confusion.     Pertinent positives and negative per HPI, all others reviewed and negative  Physical Exam   BP (!) 110/57 (BP Location: Left Arm)   Pulse 80   Temp 99.2 F (37.3 C) (Oral)   Resp 16   Ht 4\' 11"  (1.499 m)   Wt 43.1 kg   LMP 09/22/2022 Comment: Recent elective abortion  Breastfeeding No   BMI 19.19 kg/m   Patient Vitals for the past 24 hrs:  BP Temp Temp src Pulse Resp Height Weight  12/13/22 1615 (!) 110/57 -- -- 80 -- -- --  12/13/22 1535 (!) 113/57 99.2 F (37.3 C) Oral 78 16 4\' 11"  (1.499 m) 43.1 kg    Physical Exam Vitals reviewed.  Constitutional:      Appearance: Normal appearance.  HENT:     Head: Normocephalic and atraumatic.     Right Ear: External ear normal.     Left Ear: External ear normal.  Cardiovascular:     Rate and Rhythm: Normal rate and regular rhythm.  Pulmonary:     Effort: Pulmonary effort is normal.     Breath sounds: Normal breath sounds.  Abdominal:     General: Abdomen is flat.     Palpations: Abdomen is soft.     Tenderness: There is abdominal tenderness in the right lower quadrant and left lower quadrant. There is no guarding.  Skin:    General: Skin is warm.     Capillary Refill:  Capillary refill takes less than 2 seconds.  Psychiatric:        Mood and Affect: Mood normal.      Bedside Ultrasound not done  Labs Results for orders placed or performed during the hospital encounter of  12/13/22 (from the past 24 hour(s))  hCG, quantitative, pregnancy     Status: Abnormal   Collection Time: 12/13/22  4:01 PM  Result Value Ref Range   hCG, Beta Chain, Quant, S 111,989 (H) <5 mIU/mL  CBC with Differential/Platelet     Status: Abnormal   Collection Time: 12/13/22  4:01 PM  Result Value Ref Range   WBC 8.6 4.0 - 10.5 K/uL   RBC 4.03 3.87 - 5.11 MIL/uL   Hemoglobin 12.5 12.0 - 15.0 g/dL   HCT 35.6 (L) 36.0 - 46.0 %   MCV 88.3 80.0 - 100.0 fL   MCH 31.0 26.0 - 34.0 pg   MCHC 35.1 30.0 - 36.0 g/dL   RDW 13.2 11.5 - 15.5 %   Platelets 221 150 - 400 K/uL   nRBC 0.0 0.0 - 0.2 %   Neutrophils Relative % 78 %   Neutro Abs 6.7 1.7 - 7.7 K/uL   Lymphocytes Relative 12 %   Lymphs Abs 1.1 0.7 - 4.0 K/uL   Monocytes Relative 7 %   Monocytes Absolute 0.6 0.1 - 1.0 K/uL   Eosinophils Relative 2 %   Eosinophils Absolute 0.1 0.0 - 0.5 K/uL   Basophils Relative 1 %   Basophils Absolute 0.0 0.0 - 0.1 K/uL   Immature Granulocytes 0 %   Abs Immature Granulocytes 0.03 0.00 - 0.07 K/uL  ABO/Rh     Status: None   Collection Time: 12/13/22  6:19 PM  Result Value Ref Range   ABO/RH(D) O POS    No rh immune globuloin      NOT A RH IMMUNE GLOBULIN CANDIDATE, PT RH POSITIVE Performed at [facility], [place] [address], [place], [place] [address]     Imaging [place] Comp Less 14 Wks  Result Date: 12/13/2022 CLINICAL DATA:  The patient underwent a medication abortion earlier today. EXAM: OBSTETRIC <14 WK ULTRASOUND TECHNIQUE: Transabdominal ultrasound was performed for evaluation of the gestation as well as the maternal uterus and adnexal regions. COMPARISON:  Pelvic ultrasound dated 12/03/2022. FINDINGS: Intrauterine gestational sac: None Yolk sac:  Not Visualized. Embryo:   Not Visualized. Cardiac Activity: Not Visualized. Subchorionic hemorrhage:  None visualized. Maternal uterus/adnexae: The endometrial cavity measures 9 mm in thickness. A small amount of hypoechoic material is seen within the endometrial cavity without vascularization, likely reflecting blood products. IMPRESSION: A small amount of blood products are seen in the endometrial cavity. Electronically Signed   By: Zerita Boers M.D.   On: 12/13/2022 17:27    MAU Course  Procedures Lab Orders         hCG, quantitative, pregnancy         CBC with Differential/Platelet     Meds ordered this encounter  Medications   DISCONTD: fentaNYL (SUBLIMAZE) injection 12.5 mcg   fentaNYL (SUBLIMAZE) injection 12.5 mcg   ibuprofen (ADVIL) 800 MG tablet    Sig: Take 1 tablet (800 mg total) by mouth every 8 (eight) hours as needed for moderate pain or cramping.    Dispense:  30 tablet    Refill:  0   Imaging Orders         US OB Comp Less 14 Wks      MDM moderate  Assessment and Plan  Iatrogenic abortion Abdominal pain and vaginal bleeding Approximately [redacted] weeks gestation  Patient presents with abdominal pain and vaginal bleeding after taking 800 mcg of Cytotec buccally.  Patient started having abdominal pain and yellow discharge about 10 minutes after  taking medication, and then subsequently had passed large clots of blood and fetal products in the bathroom in MAU waiting room.  Pregnancy products collected and sent to pathology.  CBC wnl. hCG 111, 989. ABO/Rh O POS, no need for rhogam.  Transvaginal OB ultrasound showed blood products in utero, no products of conception.  Gave fentanyl for pain control. Will follow-up for bHCG level in 2 days. Appt already made. Education given regarding misoprostol, care after abortion and return precautions.  Dispo: discharged to home in stable condition.  Discharge Instructions     Discharge patient   Complete by: As directed    Discharge disposition: 01-Home or  Self Care   Discharge patient date: 12/13/2022      Allergies as of 12/13/2022       Reactions   Keppra [levetiracetam] Other (See Comments)   Caused VERY, VERY NEGATIVE thoughts   Coconut (cocos Nucifera) Hives   Coconut Fatty Acids    Other Reaction(s) from [employer]: Hives, Difficulty swallowing, Anaphylaxis   Other Hives   coconut        Medication List     TAKE these medications    Blood Pressure Kit Devi 1 kit by Does not apply route once a week.   brivaracetam 100 MG Tabs tablet Commonly known as: Briviact Take 1 tablet (100 mg total) by mouth 2 (two) times daily.   Depo-SubQ Provera 104 104 MG/0.65ML injection Generic drug: medroxyPROGESTERone   EPINEPHrine 0.3 mg/0.3 mL Soaj injection Commonly known as: EPI-PEN   Gojji Weight Scale Misc 1 Device by Does not apply route every 30 (thirty) days.   ibuprofen 800 MG tablet Commonly known as: ADVIL Take 1 tablet (800 mg total) by mouth every 8 (eight) hours as needed for moderate pain or cramping. What changed:  medication strength how much to take when to take this reasons to take this   Loratadine 10 MG Chew Chew 1 tablet by mouth daily.   Valtoco 15 MG Dose 2 x 7.5 MG/0.1ML Lqpk Generic drug: diazePAM (15 MG Dose) Place 15 mg into the nose as needed (for seizure lasting longer than 5 minutes).   Vitafol Gummies 3.33-0.333-34.8 MG Chew Chew 3 tablets by mouth daily.        Shelda Pal, [place] Fellow, Faculty practice [employer] for [facility] 12/13/22  7:33 PM

## 2022-12-13 NOTE — MAU Note (Signed)
1500 Report received from EMS.  Pt will go through registration/triage. Had taken medication for a termination, had taken the first pill about an hour before calling EMS.  Started having pain and a yellowish d/c.  ~1515 call  from front desk, pt in restroom, delivering.  [facility] went out to check on pt...fetus under 10 wks had delivered.  Pt taken directly to rm via wc.

## 2022-12-13 NOTE — MAU Note (Signed)
Mary Giles is a 22 y.o. at [redacted]w[redacted]d here in MAU reporting lower abdominal pain and yellow discharge after dose of Miso 800mg  buccal at 1300 today. Reports no bleeding. Pt has medication from [place].   Onset of complaint: 1330 Pain score: 10/10  Lab orders placed from triage:

## 2022-12-15 ENCOUNTER — Inpatient Hospital Stay (HOSPITAL_COMMUNITY): Payer: Medicaid Other

## 2022-12-15 LAB — SURGICAL PATHOLOGY

## 2022-12-16 ENCOUNTER — Encounter: Payer: Medicaid Other | Admitting: Obstetrics and Gynecology

## 2022-12-16 ENCOUNTER — Encounter (HOSPITAL_COMMUNITY): Payer: Self-pay | Admitting: Obstetrics and Gynecology

## 2022-12-16 ENCOUNTER — Inpatient Hospital Stay (HOSPITAL_COMMUNITY)
Admission: AD | Admit: 2022-12-16 | Discharge: 2022-12-16 | Disposition: A | Discharge status: Home / Self Care | Payer: Medicaid Other | Attending: Obstetrics and Gynecology | Admitting: Obstetrics and Gynecology

## 2022-12-16 DIAGNOSIS — Z049 Encounter for examination and observation for unspecified reason: Secondary | ICD-10-CM | POA: Insufficient documentation

## 2022-12-16 DIAGNOSIS — Z9889 Other specified postprocedural states: Secondary | ICD-10-CM | POA: Insufficient documentation

## 2022-12-16 NOTE — MAU Note (Signed)
Mary Giles is a 22 y.o. at [redacted]w[redacted]d here in MAU reporting: had abortion(medication) on 1/7, was told she had to come back for a check up to see anything was left in side.  Feeling good, no pain. Still having some bleeding, only changing once a day.  Onset of complaint: 1/7 Pain score: none Vitals:   12/16/22 1527  BP: 106/64  Pulse: 79  Resp: 16  Temp: 99.2 F (37.3 C)  SpO2: 100%     Lab orders placed from triage:   Pt to family rm

## 2022-12-16 NOTE — MAU Provider Note (Signed)
Event Date/Time   First Provider Initiated Contact with Patient 12/16/22 1533      S Ms. Mary Giles is a 22 y.o. G2P0020 patient who presents to MAU today with complaint of follow up.  Patient was seen in MAU on 1/7 after completion of medication induced termination. She presents today because she was told she would need follow up. Denies abdominal pain or fever. Reports some bleeding but 1 pad lasts the whole day.   O BP 106/64 (BP Location: Right Arm)   Pulse 79   Temp 99.2 F (37.3 C) (Oral)   Resp 16   Ht 4\' 11"  (1.499 m)   Wt 44 kg   LMP 09/22/2022 Comment: Recent elective abortion  SpO2 100%   BMI 19.61 kg/m  Physical Exam Vitals and nursing note reviewed.  Constitutional:      General: She is not in acute distress.    Appearance: Normal appearance.  Eyes:     Extraocular Movements: Extraocular movements intact.     Conjunctiva/sclera: Conjunctivae normal.     Pupils: Pupils are equal, round, and reactive to light.  Pulmonary:     Effort: Pulmonary effort is normal. No respiratory distress.  Neurological:     Mental Status: She is alert.  Psychiatric:        Mood and Affect: Mood normal.        Behavior: Behavior normal.     A Medical screening exam complete 1. Status post drug-induced abortion   -Reviewed results from previous visit. Pathology reports shows POCs. Thin endometrium on ultrasound. RH positive.  -Patient with no concerning complaints today    P Discharge from MAU in stable condition Encouraged to follow up with [facility] for contraception management - patient unsure of choice at this time - strongly encouraged condom until she decides. Should maintain pelvic rest for the next 2 weeks  Jorje Guild, NP 12/16/2022 3:33 PM

## 2022-12-21 ENCOUNTER — Ambulatory Visit (INDEPENDENT_AMBULATORY_CARE_PROVIDER_SITE_OTHER): Payer: Medicaid Other | Admitting: Pediatrics

## 2022-12-21 ENCOUNTER — Encounter (INDEPENDENT_AMBULATORY_CARE_PROVIDER_SITE_OTHER): Payer: Self-pay | Admitting: Pediatrics

## 2023-01-06 ENCOUNTER — Telehealth (INDEPENDENT_AMBULATORY_CARE_PROVIDER_SITE_OTHER): Payer: Self-pay | Admitting: Pediatrics

## 2023-01-06 NOTE — Telephone Encounter (Signed)
I contacted dad back. Verified pt's name and DOB as well as dad's name.   Dad would like to reopen the discussion about changing Mary Giles's medication to [place] since she is no longer with child.   Dad stated that Britny is emotionally okay. He would've called sooner but, was not ready to talk about the miscarriage at the time.  SS, CCMA

## 2023-01-06 NOTE — Telephone Encounter (Signed)
  Name of who is calling: hector  Caller's Relationship to Patient: dad  Best contact number: [phone]  Provider they see: Rogers Blocker  Reason for call: Was told by Dr. Rogers Blocker that if anything changes to contact back about medication. Mary Giles was pregnant, but a few weeks ago she had a miscarriage. Wants to speak about her medication please contact back.     PRESCRIPTION REFILL ONLY  Name of prescription:  Pharmacy:

## 2023-01-11 ENCOUNTER — Ambulatory Visit (INDEPENDENT_AMBULATORY_CARE_PROVIDER_SITE_OTHER): Payer: Medicaid Other | Admitting: Pediatrics

## 2023-02-04 MED ORDER — LEVETIRACETAM ER 750 MG PO TB24: 1500.0000 mg | ORAL_TABLET | Freq: Every day | ORAL | 3 refills | Status: DC

## 2023-02-04 NOTE — Addendum Note (Signed)
Addended by: Rocky Link on: 02/04/2023 07:22 PM   Modules accepted: Orders

## 2023-02-04 NOTE — Telephone Encounter (Addendum)
I called patient directly, no answer.  I left message on her voicemail to call the office back to discuss changing medication.   Then called father, who answered and handed hone over to [place].  She would rather switch to Keppra so she only has to take medication once daily.   I counseled on risks for increased irritability and/or emotionality. In particular, she reported negative thoughts previously.  If this occurs again, stop the keppra and we will need to continue on Briviact.  I would like her to titrate up on Keppra and wean off Briviact.  I will send a message through mychart with those instructions.    I recommend patient follow-up sooner than August given this change.  I will send a message to the front desk to call to schedule a follow-up appointment in April-May.    Carylon Perches MD MPH

## 2023-02-05 NOTE — Telephone Encounter (Signed)
LVM with patient and father to call back to schedule apt.

## 2023-02-10 NOTE — Telephone Encounter (Signed)
Scheduled apt for 5/2 at 2:30 pm

## 2023-04-08 ENCOUNTER — Ambulatory Visit (INDEPENDENT_AMBULATORY_CARE_PROVIDER_SITE_OTHER): Payer: Medicaid Other | Admitting: Pediatrics

## 2023-06-08 ENCOUNTER — Other Ambulatory Visit (INDEPENDENT_AMBULATORY_CARE_PROVIDER_SITE_OTHER): Payer: Self-pay | Admitting: Pediatrics

## 2023-06-08 DIAGNOSIS — G40309 Generalized idiopathic epilepsy and epileptic syndromes, not intractable, without status epilepticus: Secondary | ICD-10-CM
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
## 2023-07-12 ENCOUNTER — Encounter (INDEPENDENT_AMBULATORY_CARE_PROVIDER_SITE_OTHER): Payer: Self-pay | Admitting: Pediatrics

## 2023-07-12 ENCOUNTER — Ambulatory Visit (INDEPENDENT_AMBULATORY_CARE_PROVIDER_SITE_OTHER): Payer: Medicaid Other | Admitting: Pediatrics

## 2023-07-12 VITALS — BP 98/68 | HR 68 | Ht 59.06 in | Wt 95.0 lb

## 2023-07-12 DIAGNOSIS — Z87898 Personal history of other specified conditions: Secondary | ICD-10-CM | POA: Diagnosis not present

## 2023-07-12 DIAGNOSIS — G40109 Localization-related (focal) (partial) symptomatic epilepsy and epileptic syndromes with simple partial seizures, not intractable, without status epilepticus: Secondary | ICD-10-CM | POA: Diagnosis not present

## 2023-07-12 DIAGNOSIS — G40309 Generalized idiopathic epilepsy and epileptic syndromes, not intractable, without status epilepticus: Secondary | ICD-10-CM

## 2023-07-12 MED ORDER — VALTOCO 15 MG DOSE 7.5 MG/0.1ML NA LQPK: 15.0000 mg | NASAL | 2 refills | Status: DC | PRN

## 2023-07-12 MED ORDER — BRIVARACETAM 100 MG PO TABS: 100.0000 mg | ORAL_TABLET | Freq: Two times a day (BID) | ORAL | 11 refills | Status: AC

## 2023-07-12 NOTE — Patient Instructions (Addendum)
Briviact prescribed today with 11 refills. Voltoco refilled for prolonged seizures. Referral to adult neurology made today.  No follow-up in this clinic  [facility] Address: [address], [place], [place] [address] Phone: [phone]   General First Aid for All Seizure Types The first line of response when a person has a seizure is to provide general care and comfort and keep the person safe. The information here relates to all types of seizures. What to do in specific situations or for different seizure types is listed in the following pages. Remember that for the majority of seizures, basic seizure first aid is all that may be needed. Always Stay With the Person Until the Seizure Is Over  Seizures can be unpredictable and it's hard to tell how long they may last or what will occur during them. Some may start with minor symptoms, but lead to a loss of consciousness or fall. Other seizures may be brief and end in seconds.  Injury can occur during or after a seizure, requiring help from other people. Pay Attention to the Length of the Seizure Look at your watch and time the seizure - from beginning to the end of the active seizure.  Time how long it takes for the person to recover and return to their usual activity.  If the active seizure lasts longer than the person's typical events, call for help.  Know when to give 'as needed' or rescue treatments, if prescribed, and when to call for emergency help. Stay Calm, Most Seizures Only Last a Few Minutes A person's response to seizures can affect how other people act. If the first person remains calm, it will help others stay calm too.  Talk calmly and reassuringly to the person during and after the seizure - it will help as they recover from the seizure. Prevent Injury by Moving Nearby Objects Out of the Way  Remove sharp objects.  If you can't move surrounding objects or a person is wandering or confused, help steer them  clear of dangerous situations, for example away from traffic, train or subway platforms, heights, or sharp objects. Make the Person as Comfortable as Possible Help them sit down in a safe place.  If they are at risk of falling, call for help and lay them down on the floor.  Support the person's head to prevent it from hitting the floor. Keep Onlookers Away Once the situation is under control, encourage people to step back and give the person some room. Waking up to a crowd can be embarrassing and confusing for a person after a seizure.  Ask someone to stay nearby in case further help is needed. Do Not Forcibly Hold the Person Down Trying to stop movements or forcibly holding a person down doesn't stop a seizure. Restraining a person can lead to injuries and make the person more confused, agitated or aggressive. People don't fight on purpose during a seizure. Yet if they are restrained when they are confused, they may respond aggressively.  If a person tries to walk around, let them walk in a safe, enclosed area if possible. Do Not Put Anything in the Person's Mouth! Jaw and face muscles may tighten during a seizure, causing the person to bite down. If this happens when something is in the mouth, the person may break and swallow the object or break their teeth!  Don't worry - a person can't swallow their tongue during a seizure. Make Sure Their Breathing is Mol[place] Knock If the person is  lying down, turn them on their side, with their mouth pointing to the ground. This prevents saliva from blocking their airway and helps the person breathe more easily.  During a convulsive or tonic-clonic seizure, it may look like the person has stopped breathing. This happens when the chest muscles tighten during the tonic phase of a seizure. As this part of a seizure ends, the muscles will relax and breathing will resume normally.  Rescue breathing or CPR is generally not needed during these seizure-induced changes in a  person's breathing. Do not Give Water, Pills or Food by Mouth Unless the Person is Fully Alert If a person is not fully awake or aware of what is going on, they might not swallow correctly. Food, liquid or pills could go into the lungs instead of the stomach if they try to drink or eat at this time.  If a person appears to be choking, turn them on their side and call for help. If they are not able to cough and clear their air passages on their own or are having breathing difficulties, call 911 immediately. Call for Emergency Medical Help A seizure lasts 5 minutes or longer.  One seizure occurs right after another without the person regaining consciousness or coming to between seizures.  Seizures occur closer together than usual for that person.  Breathing becomes difficult or the person appears to be choking.  The seizure occurs in water.  Injury may have occurred.  The person asks for medical help. Be Sensitive and Supportive, and Ask Others to Do the Same Seizures can be frightening for the person having one, as well as for others. People may feel embarrassed or confused about what happened. Keep this in mind as the person wakes up.  Reassure the person that they are safe.  Once they are alert and able to communicate, tell them what happened in very simple terms.  Offer to stay with the person until they are ready to go back to normal activity or call someone to stay with them. Authored by: Lura Em, MD  Joen Laura Pamalee Leyden, RN, [place]  Maralyn Sago, MD on 06/2012  Reviewed by: Maralyn Sago  MD  Joen Laura Shafer  RN  [place] on 02/2013    .

## 2023-07-26 ENCOUNTER — Encounter (INDEPENDENT_AMBULATORY_CARE_PROVIDER_SITE_OTHER): Payer: Self-pay | Admitting: Pediatrics

## 2023-11-24 ENCOUNTER — Telehealth (INDEPENDENT_AMBULATORY_CARE_PROVIDER_SITE_OTHER): Payer: Self-pay | Admitting: Pediatrics

## 2023-11-24 NOTE — Telephone Encounter (Signed)
  Name of who is calling: Delanda, Rempfer   Caller's Relationship to Patient: Self  Best contact number: [phone]  Provider they see: Artis Flock  Reason for call: Patient called in to request a letter certifying that the patient has seizures. She is applying for some form of disability and requires it to complete the application.

## 2023-11-25 ENCOUNTER — Encounter (INDEPENDENT_AMBULATORY_CARE_PROVIDER_SITE_OTHER): Payer: Self-pay | Admitting: Pediatrics

## 2024-02-02 ENCOUNTER — Other Ambulatory Visit (INDEPENDENT_AMBULATORY_CARE_PROVIDER_SITE_OTHER): Payer: Self-pay | Admitting: Pediatrics

## 2024-02-02 DIAGNOSIS — G40109 Localization-related (focal) (partial) symptomatic epilepsy and epileptic syndromes with simple partial seizures, not intractable, without status epilepticus: Secondary | ICD-10-CM

## 2024-02-26 ENCOUNTER — Other Ambulatory Visit (INDEPENDENT_AMBULATORY_CARE_PROVIDER_SITE_OTHER): Payer: Self-pay | Admitting: Pediatrics

## 2024-02-26 DIAGNOSIS — G40109 Localization-related (focal) (partial) symptomatic epilepsy and epileptic syndromes with simple partial seizures, not intractable, without status epilepticus: Secondary | ICD-10-CM

## 2024-03-02 ENCOUNTER — Ambulatory Visit
Admission: RE | Admit: 2024-03-02 | Discharge: 2024-03-02 | Disposition: A | Discharge status: Home / Self Care | Source: Ambulatory Visit | Attending: Family Medicine | Admitting: Family Medicine

## 2024-03-02 ENCOUNTER — Other Ambulatory Visit: Payer: Self-pay | Admitting: Family Medicine

## 2024-03-02 DIAGNOSIS — M549 Dorsalgia, unspecified: Secondary | ICD-10-CM

## 2024-08-30 NOTE — Progress Notes (Signed)
 Patient: Mary Giles MRN: [id number] Sex: female DOB: [birth year]  Provider: Corean Geralds, MD Location of Care: [facility]  Note type: Routine follow-up  History of Present Illness:  Mary Giles is a 23 y.o. 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 Patient was last seen on 07/12/2023 where I continued Briviact  and Valtoco , provided information about seizure first aid, and referred to adult neurology. Since the last appointment, patient has been to the ED multiple times for seizure. She saw Bard Search, NP with [facility] on 11/03/2023 where he ordered an EMU admission, referred to behavioral health, and continued her medication. She was admitted on 12/20/2023 for an EMU admission which did not show seizures. Her Briviact  was discontinued at this admission.   Patient presents today with father who reports the following:    She had moved to [place] and saw adult neurology, now is back in [place]. Since EMU admission, she has not had any events or seizures, no longer on Briviact  for the past several months.   She is no longer seeing a therapist since moving back to [facility] for psychogenic non epileptic events.   No issues with sleep.   She is working at [employer] and that is going well. She was denied for disability.   She called the [facility] and needs a letter to get her license.    Diagnostics:  Prolonged EEG 12/20/2023 Impression: This long-term continuous EEG is within normal limits.  No epileptiform discharges, no seizures, and no focal or lateralizing signs are seen.   EEG 06/2011.  Unable to see results.     rEEG 09/17/2016:  Impression: This is a abnormal record with the patient in awake and drowsy states due to rare frontal discharges and rythmic high amplitude slow waves that appear to be frontal in origin.  Consider frontal lobe epilepsy with need for  imaging, clinical correlation advised.     rEEG 04/11/2021 Impression: This is a abnormal record with the patient in awake, drowsy and asleep states due to occipital sharp waves. Pushbutton event on Day 2 with no cortical change.  Underlying diagnosis of epilepsy remains valid, however observed was not seizure and consistent with psychogenic event. Patient medically cleared, no recommendations for antiepileptic changes.  Recommend consult with psychiatry for evaluation of behavioral health admission.     Previous AEDS: Trileptal  (worsened seizures), Keppra  (emotionality, SI), Lamictal  (never picked up)   Past Medical History Past Medical History:  Diagnosis Date   Seizures (HCC) 04/09/2021    Surgical History Past Surgical History:  Procedure Laterality Date   NO PAST SURGERIES      Family History family history includes Seizures in her father.   Social History Social History   Social History Narrative   Lives at home with mother, father (step-father), 4 siblings (1 sister, 2 brothers). Pets in home include 2 dogs, 1 rabbit, 1 chinchilla, 1 sugar glider.    Graduated in the fall of 22        Allergies Allergies  Allergen Reactions   Keppra  [Levetiracetam ] Other (See Comments)    Caused VERY, VERY NEGATIVE thoughts   Coconut (Cocos Nucifera) Hives   Coconut Fatty Acid     Other Reaction(s) from [employer]: Hives, Difficulty swallowing, Anaphylaxis   Other Hives    coconut    Medications Current Outpatient Medications on File Prior to Visit  Medication Sig Dispense Refill   Blood Pressure  Monitoring (BLOOD PRESSURE KIT) DEVI 1 kit by Does not apply route once a week. 1 each 0   ibuprofen  (ADVIL ) 800 MG tablet Take 1 tablet (800 mg total) by mouth every 8 (eight) hours as needed for moderate pain or cramping. 30 tablet 0   brivaracetam  (BRIVIACT ) 100 MG TABS tablet Take 1 tablet (100 mg total) by mouth 2 (two) times daily. (Patient not taking: Reported on 09/04/2024)  62 tablet 11   EPINEPHrine 0.3 mg/0.3 mL IJ SOAJ injection      Loratadine  10 MG CHEW Chew 1 tablet by mouth daily. (Patient not taking: Reported on 09/04/2024)     Misc. Devices (GOJJI WEIGHT SCALE) MISC 1 Device by Does not apply route every 30 (thirty) days. (Patient not taking: Reported on 12/09/2022) 1 each 0   No current facility-administered medications on file prior to visit.   The medication list was reviewed and reconciled. All changes or newly prescribed medications were explained.  A complete medication list was provided to the patient/caregiver.  Physical Exam BP 104/64 (BP Location: Left Arm, Patient Position: Sitting, Cuff Size: Normal)   Pulse 76   Wt 108 lb (49 kg)   LMP 08/05/2024 (Approximate)   BMI 21.77 kg/m  Facility age limit for growth %iles is 20 years.  No results found. Gen: well appearing young woman Skin: No rash, No neurocutaneous stigmata. HEENT: Normocephalic, no dysmorphic features, no conjunctival injection, nares patent, mucous membranes moist, oropharynx clear. Neck: Supple, no meningismus. No focal tenderness. Resp: Clear to auscultation bilaterally CV: Regular rate, normal S1/S2, no murmurs, no rubs Abd: BS present, abdomen soft, non-tender, non-distended. No hepatosplenomegaly or mass Ext: Warm and well-perfused. No deformities, no muscle wasting, ROM full.  Neurological Examination: MS: Awake, alert, interactive. Normal eye contact, answered the questions appropriately for age, speech was fluent,  Normal comprehension.  Attention and concentration were normal. Cranial Nerves: Pupils were equal and reactive to light;  normal fundoscopic exam with sharp discs, visual field full with confrontation test; EOM normal, no nystagmus; no ptsosis, no double vision, intact facial sensation, face symmetric with full strength of facial muscles, hearing intact to finger rub bilaterally, palate elevation is symmetric, tongue protrusion is symmetric with full  movement to both sides.  Sternocleidomastoid and trapezius are with normal strength. Motor-Normal tone throughout, Normal strength in all muscle groups. No abnormal movements Reflexes- Reflexes 2+ and symmetric in the biceps, triceps, patellar and achilles tendon. Plantar responses flexor bilaterally, no clonus noted Sensation: Intact to light touch throughout.  Romberg negative. Coordination: No dysmetria on FTN test. No difficulty with balance when standing on one foot bilaterally.   Gait: Normal gait. Tandem gait was normal. Was able to perform toe walking and heel walking without difficulty.    Diagnosis: 1. 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 Patient has not had any seizures since her last EMU admission and stopping Briviact . Ordered a repeat EEG to evaluate now that she has stopped Briviact . Recommend keeping Valtoco  with her until she is one year seizure free.   Refilled Valtoco  Ordered a repeat EEG Provided a letter for the DMV. Patient has been seizure free for at least ten months.   I spent 25 minutes on day of service on this patient including review of chart, discussion with patient and family, discussion of screening  results, coordination with other providers and management of orders and paperwork. This time does not include does include any behavioral screenings, baclofen pump refills, or VNS interrogations.   Return in about 5 months (around 02/03/2025).  I, Earnie Brandy, scribed for and in the presence of Corean Geralds, MD at today's visit on 09/04/2024.  I, Corean Geralds MD MPH, personally performed the services described in this documentation, as scribed by Earnie Brandy in my presence on 09/04/2024 and it is accurate, complete, and reviewed by me.     Corean Geralds MD MPH Neurology and  Neurodevelopment [facility]  [address] [place], [place], [place] [address] Phone: [phone] Fax: [phone]

## 2024-09-04 ENCOUNTER — Encounter (INDEPENDENT_AMBULATORY_CARE_PROVIDER_SITE_OTHER): Payer: Self-pay | Admitting: Pediatrics

## 2024-09-04 ENCOUNTER — Ambulatory Visit (INDEPENDENT_AMBULATORY_CARE_PROVIDER_SITE_OTHER): Payer: MEDICAID | Admitting: Pediatrics

## 2024-09-04 VITALS — BP 104/64 | HR 76 | Wt 108.0 lb

## 2024-09-04 DIAGNOSIS — G40109 Localization-related (focal) (partial) symptomatic epilepsy and epileptic syndromes with simple partial seizures, not intractable, without status epilepticus: Secondary | ICD-10-CM

## 2024-09-04 MED ORDER — DIAZEPAM (20 MG DOSE) 2 X 10 MG/0.1ML NA LQPK: 20.0000 mg | NASAL | 2 refills | Status: AC | PRN

## 2024-09-04 NOTE — Patient Instructions (Addendum)
 Refilled Valtoco  Ordered a repeat EEG Provided a letter for the [facility]

## 2024-10-09 ENCOUNTER — Encounter (INDEPENDENT_AMBULATORY_CARE_PROVIDER_SITE_OTHER): Payer: Self-pay | Admitting: Pediatrics

## 2024-12-14 NOTE — Telephone Encounter (Signed)
 Left vm for patient to confirm u/s.

## 2024-12-15 NOTE — ED Notes (Signed)
 While I did not evaluate this patient I was asked to review this patient's test results as they left before treatment was complete.  In reviewing the chart it appears that the patient was here for some abdominal cramping and has been found to have a positive pregnancy test.  Given that a set of information we would be concerned about the potential for ectopic pregnancy so I did attempt to contact this patient via phone to relay this information and encouraged her to return to the emergency department for further workup.  I left her a voicemail as she did not answer.  I will forward this to our follow-up nurse as well to attempt to recontact again tomorrow.

## 2024-12-15 NOTE — ED Triage Notes (Signed)
 States recent positive pregnancy test at home. Reports lower abdominal cramping that started about 2 days ago. Denies vaginal bleeding, vaginal discharge, or urinary complaints. States OB appt scheduled for Feb.

## 2024-12-16 ENCOUNTER — Other Ambulatory Visit: Payer: Self-pay

## 2024-12-16 ENCOUNTER — Emergency Department (HOSPITAL_COMMUNITY)
Admission: EM | Admit: 2024-12-16 | Discharge: 2024-12-16 | Disposition: A | Discharge status: Home / Self Care | Payer: MEDICAID

## 2024-12-16 ENCOUNTER — Emergency Department (HOSPITAL_COMMUNITY): Payer: MEDICAID

## 2024-12-16 DIAGNOSIS — R569 Unspecified convulsions: Secondary | ICD-10-CM | POA: Diagnosis not present

## 2024-12-16 DIAGNOSIS — G40909 Epilepsy, unspecified, not intractable, without status epilepticus: Secondary | ICD-10-CM

## 2024-12-16 DIAGNOSIS — Z3A01 Less than 8 weeks gestation of pregnancy: Secondary | ICD-10-CM | POA: Insufficient documentation

## 2024-12-16 DIAGNOSIS — O26891 Other specified pregnancy related conditions, first trimester: Secondary | ICD-10-CM | POA: Insufficient documentation

## 2024-12-16 DIAGNOSIS — Z3490 Encounter for supervision of normal pregnancy, unspecified, unspecified trimester: Secondary | ICD-10-CM

## 2024-12-16 LAB — COMPREHENSIVE METABOLIC PANEL WITH GFR
ALT: 22 U/L (ref 0–44)
AST: 20 U/L (ref 15–41)
Albumin: 4.2 g/dL (ref 3.5–5.0)
Alkaline Phosphatase: 52 U/L (ref 38–126)
Anion gap: 9 (ref 5–15)
BUN: 8 mg/dL (ref 6–20)
CO2: 24 mmol/L (ref 22–32)
Calcium: 9.6 mg/dL (ref 8.9–10.3)
Chloride: 104 mmol/L (ref 98–111)
Creatinine, Ser: 0.59 mg/dL (ref 0.44–1.00)
GFR, Estimated: >60 mL/min
Glucose, Bld: 81 mg/dL (ref 70–99)
Potassium: 4.1 mmol/L (ref 3.5–5.1)
Sodium: 137 mmol/L (ref 135–145)
Total Bilirubin: 0.8 mg/dL (ref 0.0–1.2)
Total Protein: 7 g/dL (ref 6.5–8.1)

## 2024-12-16 LAB — CBC
HCT: 39 % (ref 36.0–46.0)
Hemoglobin: 12.9 g/dL (ref 12.0–15.0)
MCH: 29.4 pg (ref 26.0–34.0)
MCHC: 33.1 g/dL (ref 30.0–36.0)
MCV: 88.8 fL (ref 80.0–100.0)
Platelets: 236 K/uL (ref 150–400)
RBC: 4.39 MIL/uL (ref 3.87–5.11)
RDW: 12.9 % (ref 11.5–15.5)
WBC: 6.3 K/uL (ref 4.0–10.5)
nRBC: 0 % (ref 0.0–0.2)

## 2024-12-16 LAB — HCG, QUANTITATIVE, PREGNANCY: hCG, Beta Chain, Quant, S: 5707 m[IU]/mL — ABNORMAL HIGH

## 2024-12-16 LAB — MAGNESIUM: Magnesium: 2 mg/dL (ref 1.7–2.4)

## 2024-12-16 LAB — CBG MONITORING, ED: Glucose-Capillary: 77 mg/dL (ref 70–99)

## 2024-12-16 MED ORDER — AZITHROMYCIN 250 MG PO TABS
1000.0000 mg | ORAL_TABLET | Freq: Once | ORAL | Status: AC
  Administered 2024-12-16: 1000 mg via ORAL
  Filled 2024-12-16: qty 4

## 2024-12-16 MED ORDER — BRIVARACETAM 100 MG PO TABS
100.0000 mg | ORAL_TABLET | Freq: Once | ORAL | Status: AC
  Administered 2024-12-16: 100 mg via ORAL
  Filled 2024-12-16: qty 1

## 2024-12-16 NOTE — Discharge Instructions (Signed)
 Continue to take your seizure medications.  Return for fevers, chills, severe headache, uncontrolled seizures, severe abdominal pain, heavy vaginal bleeding more than 2 pad an hour for 3 hours, lightheadedness dizziness, passout or any new or worsening symptoms that are concerning to you.

## 2024-12-16 NOTE — ED Triage Notes (Addendum)
 Hx of seizures,stop taking seizure meds x 2 days ago, d/t a positive preg test\. Pt reports aura before seizures that presents as leg weakness,then tonic clonic seizures, EMS denies seizures with them no meds given. Pt reports only aura this morning but no seizure yet.

## 2024-12-16 NOTE — Plan of Care (Addendum)
 Pt with seizures on Briviact  for seizure. Stopped it when she found out she was pregnant few days ago.  Had a seizure today and in the ED(at another facility I have to cover remotely) Back to baseline. Recommend restarting it, generally one of the better AEDS to be on during pregnancy.  Will need to likely titrate up in 2 and 3rd trimester as she gains weight and for this reason she needs to have an outpt Neurologist to avoid harm/injury to her baby.  Discussed with EDP  Sent a staff message to her Pediatric Neurologist, Dr. Gala.

## 2024-12-16 NOTE — ED Notes (Signed)
CBG- [id number] 

## 2024-12-16 NOTE — ED Provider Notes (Signed)
 " [place] EMERGENCY DEPARTMENT AT [facility] Provider Note   CSN: [id number] Arrival date & time: 12/16/24  9165     Patient presents with: Seizures   Mary Giles is a 24 y.o. female.   This is 24 year old female presenting emergency department for concern for seizure.  Had an aura that she typically gets prior to seizures.  However, did not actually have a seizure.  She has been off of her seizure medications for 2 days as she had a positive home pregnancy test and took herself off of these medications.  Notes some crampy lower abdominal pain.  No vaginal bleeding.    Seizures      Prior to Admission medications  Medication Sig Start Date End Date Taking? Authorizing Provider  Blood Pressure Monitoring (BLOOD PRESSURE KIT) DEVI 1 kit by Does not apply route once a week. 12/03/22   Zina Jerilynn LABOR, MD  brivaracetam  (BRIVIACT ) 100 MG TABS tablet Take 1 tablet (100 mg total) by mouth 2 (two) times daily. Patient not taking: Reported on 09/04/2024 07/12/23   Waddell Corean HERO, MD  diazePAM , 20 MG Dose, 2 x 10 MG/0.1ML LQPK Place 20 mg into the nose as needed (seizure lasting longer than 5 minutes). 09/04/24   Waddell Corean HERO, MD  EPINEPHrine 0.3 mg/0.3 mL IJ SOAJ injection  08/26/21   [provider]  ibuprofen  (ADVIL ) 800 MG tablet Take 1 tablet (800 mg total) by mouth every 8 (eight) hours as needed for moderate pain or cramping. 12/13/22   Mercado-Ortiz, Harlene RODES, DO  Loratadine  10 MG CHEW Chew 1 tablet by mouth daily. Patient not taking: Reported on 09/04/2024    [provider]  Misc. Devices (GOJJI WEIGHT SCALE) MISC 1 Device by Does not apply route every 30 (thirty) days. Patient not taking: Reported on 12/09/2022 12/03/22   Zina Jerilynn LABOR, MD    Allergies: Keppra  [levetiracetam ], Coconut (cocos nucifera), Coconut fatty acid, and Other    Review of Systems  Neurological:  Positive for seizures.    Updated Vital Signs BP 112/66 (BP  Location: Right Arm)   Pulse 82   Temp 99.8 F (37.7 C) (Oral)   Resp 18   LMP 11/15/2024 (Exact Date)   SpO2 100%   Physical Exam Vitals and nursing note reviewed.  Constitutional:      General: She is not in acute distress.    Appearance: She is not toxic-appearing.  HENT:     Head: Normocephalic.     Nose: Nose normal.     Mouth/Throat:     Mouth: Mucous membranes are moist.  Eyes:     Conjunctiva/sclera: Conjunctivae normal.  Cardiovascular:     Rate and Rhythm: Normal rate and regular rhythm.  Pulmonary:     Effort: Pulmonary effort is normal.  Abdominal:     General: Abdomen is flat. There is no distension.     Palpations: Abdomen is soft.     Tenderness: There is no abdominal tenderness. There is no guarding or rebound.  Musculoskeletal:        General: Normal range of motion.  Skin:    General: Skin is warm and dry.     Capillary Refill: Capillary refill takes less than 2 seconds.  Neurological:     Mental Status: She is alert and oriented to person, place, and time.  Psychiatric:        Mood and Affect: Mood normal.        Behavior: Behavior normal.     (  all labs ordered are listed, but only abnormal results are displayed) Labs Reviewed  HCG, QUANTITATIVE, PREGNANCY - Abnormal; Notable for the following components:      Result Value   hCG, Beta Chain, Quant, S 5,707 (*)    All other components within normal limits  CBC  COMPREHENSIVE METABOLIC PANEL WITH GFR  MAGNESIUM  CBG MONITORING, ED    EKG: EKG Interpretation Date/Time:  Saturday December 16 2024 09:13:21 EST Ventricular Rate:  82 PR Interval:  120 QRS Duration:  82 QT Interval:  358 QTC Calculation: 419 R Axis:   71  Text Interpretation: Sinus rhythm Confirmed by Neysa Clap [phone]) on 12/16/2024 10:04:13 AM  Radiology: US  OB Comp Less 14 Wks Result Date: 12/16/2024 CLINICAL DATA:  Lower abdominal pain. EXAM: OBSTETRIC <14 WK US  AND TRANSVAGINAL OB US  TECHNIQUE: Both transabdominal  and transvaginal ultrasound examinations were performed for complete evaluation of the gestation as well as the maternal uterus, adnexal regions, and pelvic cul-de-sac. Transvaginal technique was performed to assess early pregnancy. COMPARISON:  December 13, 2022 FINDINGS: Intrauterine gestational sac: Single Yolk sac:  Visualized. Embryo:  Not Visualized. Cardiac Activity: Not Visualized. Heart Rate: N/A  bpm MSD: 8.1 mm   5 w   4 d Subchorionic hemorrhage:  None visualized. Maternal uterus/adnexae: The right ovary is visualized and is normal in appearance. A 1.8 cm x 1.6 cm x 2.5 cm left corpus luteum cyst is suspected. There is a trace amount of pelvic free fluid. IMPRESSION: Probable early intrauterine gestational sac and yolk sac, but no fetal pole or cardiac activity yet visualized. Recommend follow-up quantitative B-HCG levels and follow-up US  in 14 days to assess viability. This recommendation follows SRU consensus guidelines: Diagnostic Criteria for Nonviable Pregnancy Early in the First Trimester. LOISE Diedra PARAS Med 2013; 630:8556-48. Electronically Signed   By: Suzen Dials M.D.   On: 12/16/2024 11:04   US  OB Transvaginal Result Date: 12/16/2024 CLINICAL DATA:  Lower abdominal pain. EXAM: OBSTETRIC <14 WK US  AND TRANSVAGINAL OB US  TECHNIQUE: Both transabdominal and transvaginal ultrasound examinations were performed for complete evaluation of the gestation as well as the maternal uterus, adnexal regions, and pelvic cul-de-sac. Transvaginal technique was performed to assess early pregnancy. COMPARISON:  December 13, 2022 FINDINGS: Intrauterine gestational sac: Single Yolk sac:  Visualized. Embryo:  Not Visualized. Cardiac Activity: Not Visualized. Heart Rate: N/A  bpm MSD: 8.1 mm   5 w   4 d Subchorionic hemorrhage:  None visualized. Maternal uterus/adnexae: The right ovary is visualized and is normal in appearance. A 1.8 cm x 1.6 cm x 2.5 cm left corpus luteum cyst is suspected. There is a trace amount of  pelvic free fluid. IMPRESSION: Probable early intrauterine gestational sac and yolk sac, but no fetal pole or cardiac activity yet visualized. Recommend follow-up quantitative B-HCG levels and follow-up US  in 14 days to assess viability. This recommendation follows SRU consensus guidelines: Diagnostic Criteria for Nonviable Pregnancy Early in the First Trimester. LOISE Diedra PARAS Med 2013; 630:8556-48. Electronically Signed   By: Suzen Dials M.D.   On: 12/16/2024 11:04     Procedures   Medications Ordered in the ED  brivaracetam  (BRIVIACT ) tablet 100 mg (100 mg Oral Given 12/16/24 1023)    Clinical Course as of 12/16/24 1142  Sat Dec 16, 2024  0920 Discussed case with neurology, no reason to stop Briviact  due to pregnancy.  May need dose adjustments upwards as she progresses along in pregnancy.  But, patient should follow-up with outpatient neurology. [  TY]  1128 US  OB Comp Less 14 Wks MPRESSION: Probable early intrauterine gestational sac and yolk sac, but no fetal pole or cardiac activity yet visualized. Recommend follow-up quantitative B-HCG levels and follow-up US  in 14 days to assess viability. This recommendation follows SRU consensus guidelines: Diagnostic Criteria for Nonviable Pregnancy Early in the First Trimester. LOISE Diedra PARAS Med 2013; 630:8556-48.   Electronically Signed   By: Suzen Dials M.D.   On: 12/16/2024 11:04   [TY]    Clinical Course User Index [TY] Neysa Caron PARAS, DO                                 Medical Decision Making 24 year old female with history of seizures presenting the emergency department with aura and concerned she may have a seizure.  Recent home pregnancy test positive.  Per chart review and to [facility], but left prior to being seen had an elevated hCG level then.  It does appear that she has took herself off of Briviact .  She is overall well-appearing, benign abdominal exam.  Is having some lower abdominal discomfort/cramping.  But no vaginal  bleeding.  No dysuria.  She has not established with an OB/GYN yet.  Considered CT head, however history of seizures, did not actually have a seizure nonfocal neuroexam.  Low suspicion for acute intracranial pathology.  Discussed medication use with neurology, recommending continuing her Briviact .  Given a dose here.  Given her elevated hCG level and lower abdominal pain obtained ultrasound, appears to have gestational sac but radiology recommending repeat hCG and ultrasound in 2 weeks.  She has no other significant metabolic derangements normal kidney function.  No leukocytosis or anemia.  Discussed these findings with patient.  Instructed her to follow-up with her OB/GYN that she is in process to establish care or to follow-up with the [facility].  Encouraged her to take her seizure medications.  Upon discharge she notes that she was tested positive for chlamydia on 1/6 but did not receive any treatment.  Unable to provide records for this however given patient is pregnant, will cover with antibiotics empirically.  Will discharge in stable condition.  Amount and/or Complexity of Data Reviewed Labs: ordered. Radiology: ordered. Decision-making details documented in ED Course. ECG/medicine tests: ordered.  Risk Prescription drug management.       Final diagnoses:  None    ED Discharge Orders     None          Neysa Caron PARAS, DO 12/16/24 1142  "

## 2024-12-25 ENCOUNTER — Telehealth (INDEPENDENT_AMBULATORY_CARE_PROVIDER_SITE_OTHER): Payer: Self-pay | Admitting: Pediatrics

## 2024-12-25 NOTE — Telephone Encounter (Signed)
 Patient seen 1/10 for seizure, restarted on Briviact  for report that she weaned when she found out she was pregnant, however she had been off medication without seizure back in September. Patient with history of psychogenic seizure, would not require medication under these circumstances.   Patient schedule with me in March. Please contact patient and schedule next available, it looks like I have availability as early as 1/26.   Corean Geralds MD MPH

## 2024-12-25 NOTE — Telephone Encounter (Signed)
 Attempted to contact patient. Patient unable to be reached.  Unable to LVM.  No DPR on file to contact family.   SS, CCMA

## 2025-02-08 ENCOUNTER — Ambulatory Visit (INDEPENDENT_AMBULATORY_CARE_PROVIDER_SITE_OTHER): Payer: Self-pay | Admitting: Pediatrics

## 2025-02-08 ENCOUNTER — Other Ambulatory Visit (INDEPENDENT_AMBULATORY_CARE_PROVIDER_SITE_OTHER): Payer: MEDICAID
